# Patient Record
Sex: Female | Born: 1980 | Race: White | Hispanic: No | Marital: Married | Smoking: Never smoker
Health system: Southern US, Community
[De-identification: ages and names within clinical notes are randomized; demographics above are authoritative.]

## PROBLEM LIST (undated history)

## (undated) DIAGNOSIS — F419 Anxiety disorder, unspecified: Secondary | ICD-10-CM

## (undated) DIAGNOSIS — F32A Depression, unspecified: Secondary | ICD-10-CM

## (undated) DIAGNOSIS — B019 Varicella without complication: Secondary | ICD-10-CM

## (undated) DIAGNOSIS — D219 Benign neoplasm of connective and other soft tissue, unspecified: Secondary | ICD-10-CM

## (undated) HISTORY — DX: Anxiety disorder, unspecified: F41.9

## (undated) HISTORY — DX: Benign neoplasm of connective and other soft tissue, unspecified: D21.9

## (undated) HISTORY — PX: BREAST CYST EXCISION: SHX579

## (undated) HISTORY — DX: Varicella without complication: B01.9

## (undated) HISTORY — DX: Depression, unspecified: F32.A

## (undated) HISTORY — PX: TUBAL LIGATION: SHX77

---

## 2001-02-10 ENCOUNTER — Other Ambulatory Visit: Admission: RE | Admit: 2001-02-10 | Discharge: 2001-02-10 | Payer: Self-pay | Admitting: Family Medicine

## 2019-08-30 LAB — RESULTS CONSOLE HPV: CHL HPV: NEGATIVE

## 2019-08-30 LAB — HM PAP SMEAR: HM Pap smear: NORMAL

## 2020-12-10 ENCOUNTER — Ambulatory Visit: Admit: 2020-12-10 | Discharge status: Absent without leave | Payer: Self-pay

## 2021-07-03 ENCOUNTER — Emergency Department (HOSPITAL_BASED_OUTPATIENT_CLINIC_OR_DEPARTMENT_OTHER): Payer: 59

## 2021-07-03 ENCOUNTER — Other Ambulatory Visit: Payer: Self-pay

## 2021-07-03 ENCOUNTER — Other Ambulatory Visit (HOSPITAL_BASED_OUTPATIENT_CLINIC_OR_DEPARTMENT_OTHER): Payer: Self-pay

## 2021-07-03 ENCOUNTER — Encounter (HOSPITAL_BASED_OUTPATIENT_CLINIC_OR_DEPARTMENT_OTHER): Payer: Self-pay

## 2021-07-03 ENCOUNTER — Emergency Department (HOSPITAL_BASED_OUTPATIENT_CLINIC_OR_DEPARTMENT_OTHER)
Admission: EM | Admit: 2021-07-03 | Discharge: 2021-07-03 | Disposition: A | Discharge status: Home / Self Care | Payer: 59 | Attending: Emergency Medicine | Admitting: Emergency Medicine

## 2021-07-03 ENCOUNTER — Emergency Department (HOSPITAL_BASED_OUTPATIENT_CLINIC_OR_DEPARTMENT_OTHER): Payer: 59 | Admitting: Radiology

## 2021-07-03 DIAGNOSIS — W010XXA Fall on same level from slipping, tripping and stumbling without subsequent striking against object, initial encounter: Secondary | ICD-10-CM | POA: Diagnosis not present

## 2021-07-03 DIAGNOSIS — S6992XA Unspecified injury of left wrist, hand and finger(s), initial encounter: Secondary | ICD-10-CM | POA: Diagnosis present

## 2021-07-03 DIAGNOSIS — S52125A Nondisplaced fracture of head of left radius, initial encounter for closed fracture: Secondary | ICD-10-CM | POA: Diagnosis not present

## 2021-07-03 MED ORDER — OXYCODONE-ACETAMINOPHEN 5-325 MG PO TABS
1.0000 | ORAL_TABLET | Freq: Four times a day (QID) | ORAL | 0 refills | Status: DC | PRN
  Filled 2021-07-03: qty 10, 3d supply, fill #0

## 2021-07-03 MED ORDER — OXYCODONE-ACETAMINOPHEN 5-325 MG PO TABS
1.0000 | ORAL_TABLET | Freq: Once | ORAL | Status: AC
  Administered 2021-07-03: 1 via ORAL
  Filled 2021-07-03: qty 1

## 2021-07-03 NOTE — ED Provider Notes (Signed)
[facility] EMERGENCY DEPT Provider Note   CSN: [id number] Arrival date & time: 07/03/21  4818     History  Chief Complaint  Patient presents with   Marletta Lor    Valina Maes is a 41 y.o. female.  Presented to the emergency room after a fall.  States that she tripped over a baby gate and fell from standing landing on her left elbow.  Only pain right now is in her left elbow.  Earlier had some pain in her ankle but this is gone away and she has been able to walk since the accident.  Pain is aching, sore, worse with movement improved with rest, currently moderate.  Denies any medical problems.  No numbness or weakness in her arm or leg.  HPI     Home Medications Prior to Admission medications   Medication Sig Start Date End Date Taking? Authorizing Provider  oxyCODONE-acetaminophen (PERCOCET/ROXICET) 5-325 MG tablet Take 1 tablet by mouth every 6 (six) hours as needed for up to 3 days for severe pain. 07/03/21 07/06/21 Yes Milagros Loll, MD      Allergies    Patient has no known allergies.    Review of Systems   Review of Systems  Musculoskeletal:  Positive for arthralgias.  All other systems reviewed and are negative.  Physical Exam Updated Vital Signs BP (!) 146/86    Pulse 81    Temp 98.6 F (37 C)    Resp 18    Wt 79.4 kg    SpO2 100%  Physical Exam Vitals and nursing note reviewed.  Constitutional:      General: She is not in acute distress.    Appearance: She is well-developed.  HENT:     Head: Normocephalic and atraumatic.  Eyes:     Conjunctiva/sclera: Conjunctivae normal.  Cardiovascular:     Rate and Rhythm: Normal rate and regular rhythm.     Heart sounds: No murmur heard. Pulmonary:     Effort: Pulmonary effort is normal. No respiratory distress.  Musculoskeletal:     Cervical back: Neck supple.     Comments: Left upper extremity: There is swelling, tenderness to the left elbow, no tenderness to palpation throughout remainder of extremity,  radial pulse intact, distal sensation and motor intact Left lower extremity: No tenderness to palpation throughout extremity, no deformity  Skin:    General: Skin is warm and dry.     Capillary Refill: Capillary refill takes less than 2 seconds.  Neurological:     Mental Status: She is alert.  Psychiatric:        Mood and Affect: Mood normal.    ED Results / Procedures / Treatments   Labs (all labs ordered are listed, but only abnormal results are displayed) Labs Reviewed - No data to display  EKG None  Radiology DG Elbow Complete Left  Result Date: 07/03/2021 CLINICAL DATA:  41 year old female status post fall with pain radiating from the elbow. EXAM: LEFT ELBOW - COMPLETE 3+ VIEW COMPARISON:  None. FINDINGS: Positive for evidence of joint effusion on the lateral view. Mildly displaced fracture of the left radial head articular surface. Maintained joint spaces and alignment. Normal underlying bone mineralization. Distal humerus and proximal ulna appear intact. IMPRESSION: Mildly displaced left radial head articular surface fracture with hemarthrosis. Electronically Signed   By: Odessa Fleming M.D.   On: 07/03/2021 09:51   CT Elbow Left Wo Contrast  Result Date: 07/03/2021 CLINICAL DATA:  Fracture, elbow elbow fracture, ortho wants for potential  operative planning EXAM: CT OF THE UPPER LEFT EXTREMITY WITHOUT CONTRAST TECHNIQUE: Multidetector CT imaging of the upper left extremity was performed according to the standard protocol. RADIATION DOSE REDUCTION: This exam was performed according to the departmental dose-optimization program which includes automated exposure control, adjustment of the mA and/or kV according to patient size and/or use of iterative reconstruction technique. COMPARISON:  Same day radiograph FINDINGS: Bones/Joint/Cartilage There is a minimally displaced radial head fracture with 2-3 mm articular surface step-off. Associated small hemarthrosis. There is no proximal ulna  fracture or distal humerus fracture. Ligaments Suboptimally assessed by CT. Muscles and Tendons No acute myotendinous abnormality on CT. Soft tissues No focal fluid collection IMPRESSION: Minimally displaced radial head fracture with 2-3 mm articular surface step-off. Associated small hemarthrosis. Electronically Signed   By: Caprice Renshaw M.D.   On: 07/03/2021 11:05    Procedures Procedures    Medications Ordered in ED Medications  oxyCODONE-acetaminophen (PERCOCET/ROXICET) 5-325 MG per tablet 1 tablet (1 tablet Oral Given 07/03/21 1022)    ED Course/ Medical Decision Making/ A&P Clinical Course as of 07/03/21 1347  Wed Jul 03, 2021  1017 D/w [place] - he wants CT to better characterize fx, regardless of results, needs posterior long arm splint and out pt fu in his clinic [RD]    Clinical Course User Index [RD] Milagros Loll, MD                           Medical Decision Making Amount and/or Complexity of Data Reviewed Radiology: ordered.  Risk Prescription drug management.   41 year old presented to ER with concern for elbow injury after mechanical fall.  On physical exam, only area of tenderness or swelling is left elbow.  Plain films obtained, I independently reviewed and interpreted, concerning for nondisplaced radial head fracture.  I discussed the case with Dr. Dion Saucier with orthopedics.  He requested CT scan in ER to better characterize fracture for follow-up purposes, recommending posterior long-arm splint and follow-up in his clinic.  Patient placed in splint by staff, provided sling.  Recommend NWB for now.  Case also discussed with patient's husband at bedside.  Patient and husband demonstrate understanding of need follow-up in the outpatient setting for further management.    After the discussed management above, the patient was determined to be safe for discharge.  The patient was in agreement with this plan and all questions regarding their care were answered.  ED return  precautions were discussed and the patient will return to the ED with any significant worsening of condition.         Final Clinical Impression(s) / ED Diagnoses Final diagnoses:  Closed nondisplaced fracture of head of left radius, initial encounter    Rx / DC Orders ED Discharge Orders          Ordered    oxyCODONE-acetaminophen (PERCOCET/ROXICET) 5-325 MG tablet  Every 6 hours PRN        07/03/21 1109              Milagros Loll, MD 07/03/21 1347

## 2021-07-03 NOTE — Discharge Instructions (Signed)
Please follow-up with Dr. Dion Saucier.  Call his office to request an appointment.  Until you receive further instructions from the orthopedist, recommend no weightbearing in your left arm.  Use sling as needed.  Keep splint intact.  For pain control, take Tylenol and Motrin as needed.  For breakthrough pain take the prescribed Percocet.  Note this can make you drowsy and should not be taken while driving or operating heavy machinery.

## 2021-07-03 NOTE — ED Triage Notes (Signed)
Pt presents POV for Left elbow pain after a mechanical fall this am. Pt's foot got caught while stepping over the baby gate. Pt denies hitting her head or LOC

## 2021-07-04 ENCOUNTER — Encounter: Payer: Self-pay | Admitting: Physician Assistant

## 2021-07-04 ENCOUNTER — Ambulatory Visit (INDEPENDENT_AMBULATORY_CARE_PROVIDER_SITE_OTHER): Payer: 59 | Admitting: Physician Assistant

## 2021-07-04 DIAGNOSIS — S52122A Displaced fracture of head of left radius, initial encounter for closed fracture: Secondary | ICD-10-CM | POA: Diagnosis not present

## 2021-07-04 NOTE — Progress Notes (Signed)
Office Visit Note   Patient: Stephanie Garcia           Date of Birth: December 25, 1980           MRN: [id number] Visit Date: 07/04/2021              Requested by: Ronal Fear, NP [address] [place],  [place] [address] PCP: Ronal Fear, NP  No chief complaint on file.     HPI: Stephanie Garcia is a pleasant healthy right-hand-dominant 41 year old woman who is 24 hours status post tripping over a baby gate in her home.  She had pain and swelling in her left elbow and was seen and evaluated in the emergency room.  X-rays demonstrated a positive sail sign and a slightly displaced radial head fracture.  CT was also ordered and confirmation.  She was to follow-up with the orthopedic on-call but he is not under her insurance.  She presents today.  She denies any numbing or tingling in her hand.  She denies any previous injuries and in fact says this is her first broken bone.  She is not a smoker she is not a diabetic she is currently taking Motrin and occasionally Percocet for pain  Assessment & Plan: Visit Diagnoses: Left elbow fracture  Plan: Acute radial head fracture of the left elbow.  I did review the CT scan and the x-rays with both Dr. Frazier Butt and the patient.  She will continue to immobilize her arm in a posterior splint.  She is good to use a small squeeze ball to keep some of the swelling out of her fingers and I encouraged her to move her fingers.  She may contact me if she needs a refill of her pain medication.  She will follow-up with Dr. 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   Ortho Exam  Patient is alert, oriented, no adenopathy, well-dressed, normal affect, normal respiratory effort. Examination of her left arm her arm is immobilized in a posterior splint in good condition.  Her compartments are compressible.  She has a strong radial pulse.  She does have some swelling in her  digits but is able to oppose all of her fingers.  Less than 2-second capillary refill no paresthesias no other pain  Imaging: DG Elbow Complete Left  Result Date: 07/03/2021 CLINICAL DATA:  41 year old female status post fall with pain radiating from the elbow. EXAM: LEFT ELBOW - COMPLETE 3+ VIEW COMPARISON:  None. FINDINGS: Positive for evidence of joint effusion on the lateral view. Mildly displaced fracture of the left radial head articular surface. Maintained joint spaces and alignment. Normal underlying bone mineralization. Distal humerus and proximal ulna appear intact. IMPRESSION: Mildly displaced left radial head articular surface fracture with hemarthrosis. Electronically Signed   By: Odessa Fleming M.D.   On: 07/03/2021 09:51   CT Elbow Left Wo Contrast  Result Date: 07/03/2021 CLINICAL DATA:  Fracture, elbow elbow fracture, ortho wants for potential operative planning EXAM: CT OF THE UPPER LEFT EXTREMITY WITHOUT CONTRAST TECHNIQUE: Multidetector CT imaging of the upper left extremity was performed according to the standard protocol. RADIATION DOSE REDUCTION: This exam was performed according to the departmental dose-optimization program which includes automated exposure control, adjustment of the mA and/or kV according to patient size and/or use of iterative reconstruction technique. COMPARISON:  Same day radiograph FINDINGS: Bones/Joint/Cartilage There is a minimally displaced radial head fracture with 2-3  mm articular surface step-off. Associated small hemarthrosis. There is no proximal ulna fracture or distal humerus fracture. Ligaments Suboptimally assessed by CT. Muscles and Tendons No acute myotendinous abnormality on CT. Soft tissues No focal fluid collection IMPRESSION: Minimally displaced radial head fracture with 2-3 mm articular surface step-off. Associated small hemarthrosis. Electronically Signed   By: Caprice Renshaw M.D.   On: 07/03/2021 11:05   No images are attached to the  encounter.  Labs: No results found for: HGBA1C, ESRSEDRATE, CRP, LABURIC, REPTSTATUS, GRAMSTAIN, CULT, LABORGA   No results found for: ALBUMIN, PREALBUMIN, CBC  No results found for: MG No results found for: VD25OH  No results found for: PREALBUMIN No flowsheet data found.   There is no height or weight on file to calculate BMI.  Orders:  No orders of the defined types were placed in this encounter.  No orders of the defined types were placed in this encounter.    Procedures: No procedures performed  Clinical Data: No additional findings.  ROS:  All other systems negative, except as noted in the HPI. Review of Systems  Objective: Vital Signs: There were no vitals taken for this visit.  Specialty Comments:  No specialty comments available.  PMFS History: There are no problems to display for this patient.  No past medical history on file.  No family history on file.  Past Surgical History:  Procedure Laterality Date   BREAST CYST EXCISION     TUBAL LIGATION     Social History   Occupational History   Not on file  Tobacco Use   Smoking status: Never   Smokeless tobacco: Never  Vaping Use   Vaping Use: Never used  Substance and Sexual Activity   Alcohol use: Yes    Comment: social   Drug use: Never   Sexual activity: Never

## 2021-07-05 ENCOUNTER — Other Ambulatory Visit: Payer: Self-pay | Admitting: Surgical

## 2021-07-05 ENCOUNTER — Telehealth: Payer: Self-pay | Admitting: Physician Assistant

## 2021-07-05 MED ORDER — OXYCODONE-ACETAMINOPHEN 5-325 MG PO TABS: 1.0000 | ORAL_TABLET | Freq: Four times a day (QID) | ORAL | 0 refills | Status: AC | PRN

## 2021-07-05 NOTE — Telephone Encounter (Signed)
I just sent in the pain medicine now, refilled oxycodone

## 2021-07-05 NOTE — Telephone Encounter (Signed)
Pt called about update about pain medication before the end of the day. Pt states she need meds for over the weekend. Pt is asking for a call back or see if another dr can sign off on medication. Pt phone number is [phone].

## 2021-07-05 NOTE — Telephone Encounter (Signed)
Could you please advise since Dr. Whitfield/Maryanne are out of office? °

## 2021-07-05 NOTE — Telephone Encounter (Addendum)
Patient called needing Rx refilled Percocet. Patient said she will run out over the weekend. Patient uses the [facility] in [place]   The number to contact patient [phone]

## 2021-07-05 NOTE — Telephone Encounter (Signed)
IC advised.  

## 2021-07-11 ENCOUNTER — Other Ambulatory Visit: Payer: Self-pay

## 2021-07-11 ENCOUNTER — Ambulatory Visit (INDEPENDENT_AMBULATORY_CARE_PROVIDER_SITE_OTHER): Payer: 59 | Admitting: Orthopedic Surgery

## 2021-07-11 DIAGNOSIS — S52122A Displaced fracture of head of left radius, initial encounter for closed fracture: Secondary | ICD-10-CM | POA: Insufficient documentation

## 2021-07-11 NOTE — Progress Notes (Signed)
° °  Office Visit Note   Patient: Stephanie Garcia           Date of Birth: March 03, 1981           MRN: [id number] Visit Date: 07/11/2021              Requested by: Ronal Fear, NP [address] [place],  [place] [address] PCP: Ronal Fear, NP   Assessment & Plan: Visit Diagnoses:  1. Closed displaced fracture of head of left radius, initial encounter     Plan: Reviewed the x-rays and CT scan with the patient and her husband which demonstrate a Mason 2 radial head fracture with a small fragment.  Discussed that the majority of the radial head articular surface is intact.  She has no mechanical block to motion in flexion/extension or pronation/supination.  We will plan on treating this nonoperatively with early ROM.  She can follow up in two weeks.   Follow-Up Instructions: No follow-ups on file.   Orders:  No orders of the defined types were placed in this encounter.  No orders of the defined types were placed in this encounter.     Procedures: No procedures performed   Clinical Data: No additional findings.   Subjective: Chief Complaint  Patient presents with   Left Elbow - Fracture, Follow-up    This is a 41 yo RHD F who presents with a left radial head fracture after tripping over a baby gate at their house just over a week ago.  She has been in a long arm posterior splint.  Her pain has been well controlled in the splint.  She has significant elbow pain w/ attempted ROM out of the splint that is worse w/ flexion/extension.  She denies pain elsewhere in the extremity.  She denies numbness or paresthesias.    Review of Systems   Objective: Vital Signs: There were no vitals taken for this visit.  Physical Exam Constitutional:      Appearance: Normal appearance.  Cardiovascular:     Rate and Rhythm: Normal rate.     Pulses: Normal pulses.  Skin:    General: Skin is warm and dry.     Capillary Refill: Capillary refill takes less than 2 seconds.  Neurological:      Mental Status: She is alert.    Right Elbow Exam   Tenderness  The patient is experiencing tenderness in the radial capitellar joint.   Other  Erythema: absent Sensation: normal Pulse: present  Comments:  Pain w/ active flexion/extension and pronation/supination but without mechanical block to motion.  Full pronation/supination.     Specialty Comments:  No specialty comments available.  Imaging: No results found.   PMFS History: Patient Active Problem List   Diagnosis Date Noted   Left radial head fracture 07/11/2021   No past medical history on file.  No family history on file.  Past Surgical History:  Procedure Laterality Date   BREAST CYST EXCISION     TUBAL LIGATION     Social History   Occupational History   Not on file  Tobacco Use   Smoking status: Never   Smokeless tobacco: Never  Vaping Use   Vaping Use: Never used  Substance and Sexual Activity   Alcohol use: Yes    Comment: social   Drug use: Never   Sexual activity: Never

## 2021-07-25 ENCOUNTER — Ambulatory Visit (INDEPENDENT_AMBULATORY_CARE_PROVIDER_SITE_OTHER): Payer: 59

## 2021-07-25 ENCOUNTER — Ambulatory Visit: Payer: 59 | Admitting: Orthopedic Surgery

## 2021-07-25 ENCOUNTER — Other Ambulatory Visit: Payer: Self-pay

## 2021-07-25 ENCOUNTER — Encounter: Payer: Self-pay | Admitting: Orthopedic Surgery

## 2021-07-25 DIAGNOSIS — S52122A Displaced fracture of head of left radius, initial encounter for closed fracture: Secondary | ICD-10-CM

## 2021-07-25 NOTE — Progress Notes (Signed)
? ?  Office Visit Note ?  ?Patient: Stephanie Garcia           ?Date of Birth: 05-23-1980           ?MRN: [id number] ?Visit Date: 07/25/2021 ?             ?Requested by: Ronal Fear, NP ?[address] [place] ?[place],  [place] [address] ?PCP: Ronal Fear, NP ? ? ?Assessment & Plan: ?Visit Diagnoses:  ?1. Closed displaced fracture of head of left radius, initial encounter   ? ? ?Plan: Patient is doing much better today than at her last visit.  She is using her left arm with minimal discomfort.  She has near complete ROM but lacks approximately 5-10 degrees of terminal extension.  She has full pronation/supination.  She will continue to use her LUE as tolerated.   Discussed that if she has trouble gaining the last few degrees of extension then we can start her in formal therapy.  I can see her back in another month.  ? ?Follow-Up Instructions: No follow-ups on file.  ? ?Orders:  ?Orders Placed This Encounter  ?Procedures  ? XR Elbow Complete Left (3+View)  ? ?No orders of the defined types were placed in this encounter. ? ? ? ? Procedures: ?No procedures performed ? ? ?Clinical Data: ?No additional findings. ? ? ?Subjective: ?Chief Complaint  ?Patient presents with  ? Left Elbow - Follow-up  ? ? ?This is a 41 yo RHD F who presents for follow up of a left radial head fracture after tripping over a baby gate at their house approximately 3 weeks ago.  She has been out of her splint and working on range of motion for the last two weeks.  She is using her LUE with her usual daily activities.  She has regained almost all of her ROM.  She has some mild radial sided "tightness" with certain activities.  ? ? ?Review of Systems ? ? ?Objective: ?Vital Signs: There were no vitals taken for this visit. ? ?Physical Exam ? ?Left Elbow Exam  ? ?Tenderness  ?Left elbow tenderness location: Mild tenderness at radial head.  ? ?Other  ?Erythema: absent ?Sensation: normal ?Pulse: present ? ?Comments:  Near full flexion.  Lacks approximately 10 degrees  of extension.  Full pronation and supination.  ? ? ? ? ?Specialty Comments:  ?No specialty comments available. ? ?Imaging: ?No results found. ? ? ?PMFS History: ?Patient Active Problem List  ? Diagnosis Date Noted  ? Left radial head fracture 07/11/2021  ? ?History reviewed. No pertinent past medical history.  ?History reviewed. No pertinent family history.  ?Past Surgical History:  ?Procedure Laterality Date  ? BREAST CYST EXCISION    ? TUBAL LIGATION    ? ?Social History  ? ?Occupational History  ? Not on file  ?Tobacco Use  ? Smoking status: Never  ? Smokeless tobacco: Never  ?Vaping Use  ? Vaping Use: Never used  ?Substance and Sexual Activity  ? Alcohol use: Yes  ?  Comment: social  ? Drug use: Never  ? Sexual activity: Never  ? ? ? ? ? ? ?

## 2021-08-22 ENCOUNTER — Encounter: Payer: Self-pay | Admitting: Orthopedic Surgery

## 2021-08-22 ENCOUNTER — Ambulatory Visit: Payer: 59 | Admitting: Orthopedic Surgery

## 2021-08-22 DIAGNOSIS — S52122A Displaced fracture of head of left radius, initial encounter for closed fracture: Secondary | ICD-10-CM

## 2021-08-22 NOTE — Progress Notes (Signed)
? ?  Office Visit Note ?  ?Patient: Stephanie Garcia           ?Date of Birth: 09-22-1980           ?MRN: [id number] ?Visit Date: 08/22/2021 ?             ?Requested by: Philmore Pali, NP ?[address] ?[place],  [place] [address] ?PCP: Philmore Pali, NP ? ? ?Assessment & Plan: ?Visit Diagnoses:  ?1. Closed displaced fracture of head of left radius, initial encounter   ? ? ?Plan: Patient is 7 weeks s/p above fracture treated conservatively with early ROM.  She is doing very well.  She has full and painless elbow ROM.  She is overall very pleased with her result.  She can follow up with me as needed.  ? ?Follow-Up Instructions: No follow-ups on file.  ? ?Orders:  ?No orders of the defined types were placed in this encounter. ? ?No orders of the defined types were placed in this encounter. ? ? ? ? Procedures: ?No procedures performed ? ? ?Clinical Data: ?No additional findings. ? ? ?Subjective: ?Chief Complaint  ?Patient presents with  ? Left Elbow - Follow-up  ? ? ?This is a 41 yo RHD F who presents for follow up of a left radial head fracture after tripping over a baby gate at their house approximately 7 weeks ago.  We have treated the fracture conservatively with early ROM.  She has no pain today w/ full ROM.  She is able to do all of her daily tasks including carrying 50 lb chicken feed sacks without difficulty.  ? ? ?Review of Systems ? ? ?Objective: ?Vital Signs: There were no vitals taken for this visit. ? ?Physical Exam ? ?Left Elbow Exam  ? ?Tenderness  ?The patient is experiencing no tenderness.  ? ?Range of Motion  ?The patient has normal left elbow ROM. ? ?Other  ?Erythema: absent ?Sensation: normal ?Pulse: present ? ? ? ? ?Specialty Comments:  ?No specialty comments available. ? ?Imaging: ?No results found. ? ? ?PMFS History: ?Patient Active Problem List  ? Diagnosis Date Noted  ? Left radial head fracture 07/11/2021  ? ?History reviewed. No pertinent past medical history.  ?History reviewed. No pertinent family  history.  ?Past Surgical History:  ?Procedure Laterality Date  ? BREAST CYST EXCISION    ? TUBAL LIGATION    ? ?Social History  ? ?Occupational History  ? Not on file  ?Tobacco Use  ? Smoking status: Never  ? Smokeless tobacco: Never  ?Vaping Use  ? Vaping Use: Never used  ?Substance and Sexual Activity  ? Alcohol use: Yes  ?  Comment: social  ? Drug use: Never  ? Sexual activity: Never  ? ? ? ? ? ? ?

## 2023-04-29 IMAGING — CT CT ELBOW*L* W/O CM
4 series · 10 of 46 positions shown, 15 images · non-contrast
Comparison: Same day radiograph

CLINICAL DATA: Fracture, elbow elbow fracture, ortho wants for
potential operative planning

EXAM:
CT OF THE UPPER LEFT EXTREMITY WITHOUT CONTRAST
TECHNIQUE: Multidetector CT imaging of the upper left extremity was performed
according to the standard protocol.
RADIATION DOSE REDUCTION: This exam was performed according to the
departmental dose-optimization program which includes automated
exposure control, adjustment of the mA and/or kV according to
patient size and/or use of iterative reconstruction technique.

[Series 2: thin bone · axial · 0.49mm/px · z∈[+88,+111]mm · 2 of 381 slices shown]
[im 31/381  bone]
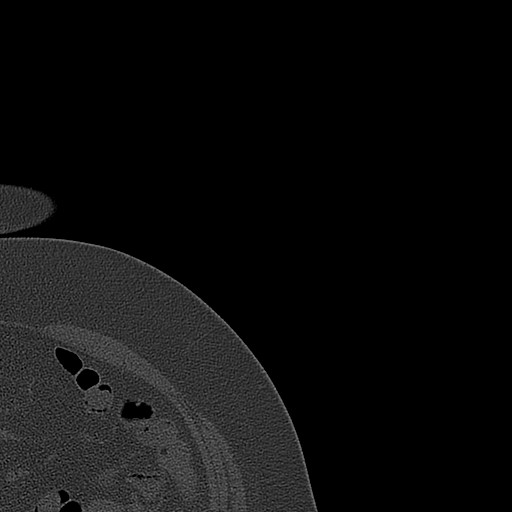
[im 77/381  bone]
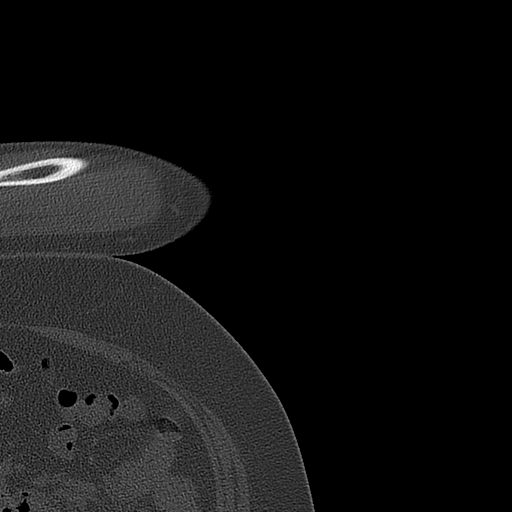

[Series 5: axial soft · axial · 0.49mm/px · z∈[+135,+234]mm · 4 of 90 slices shown, 9 images]
[im 18/90  soft-tissue]
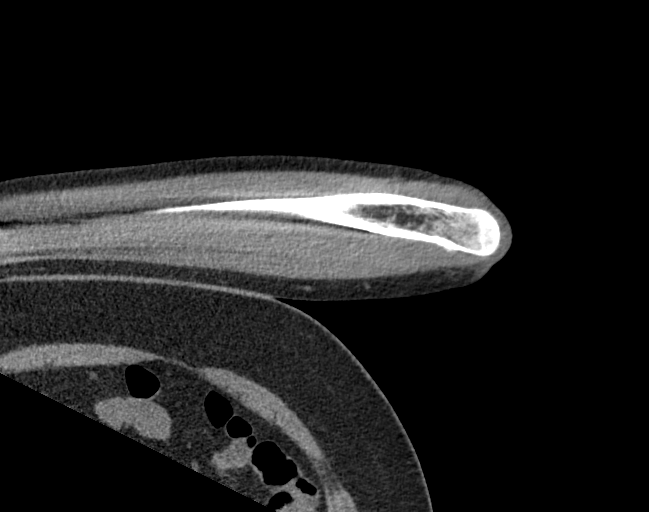
[im 18/90  lung]
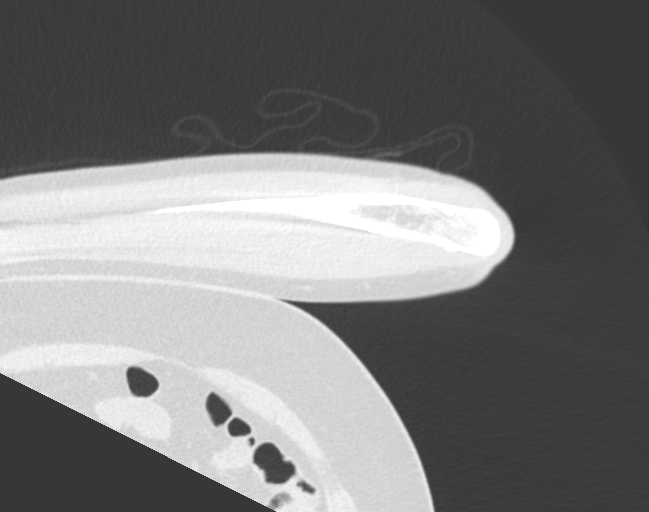
[im 18/90  bone]
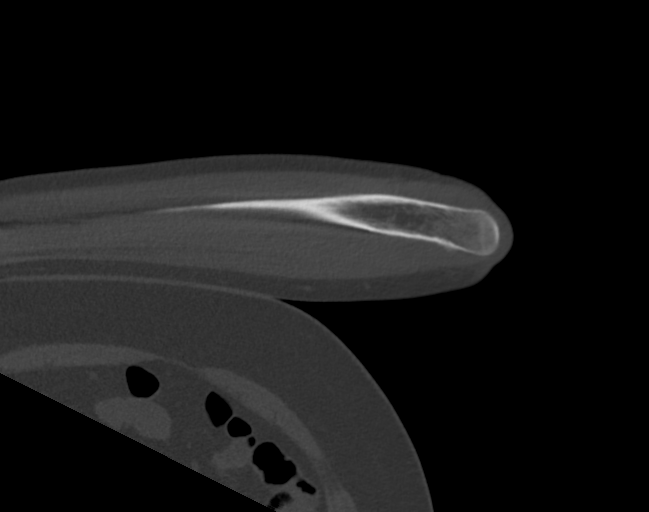
[im 36/90  soft-tissue]
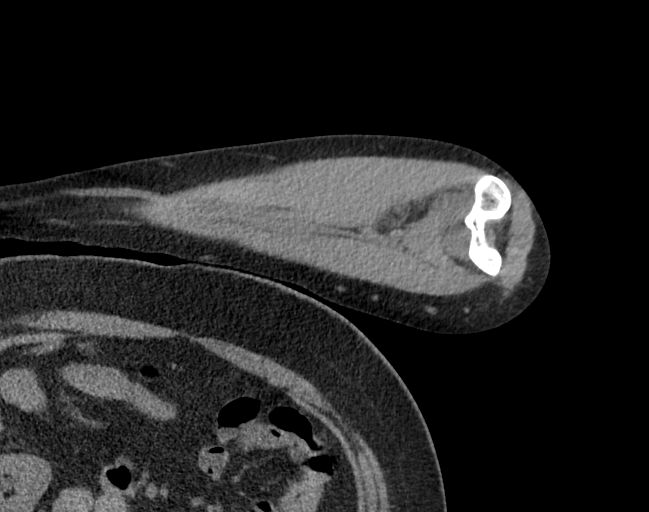
[im 36/90  lung]
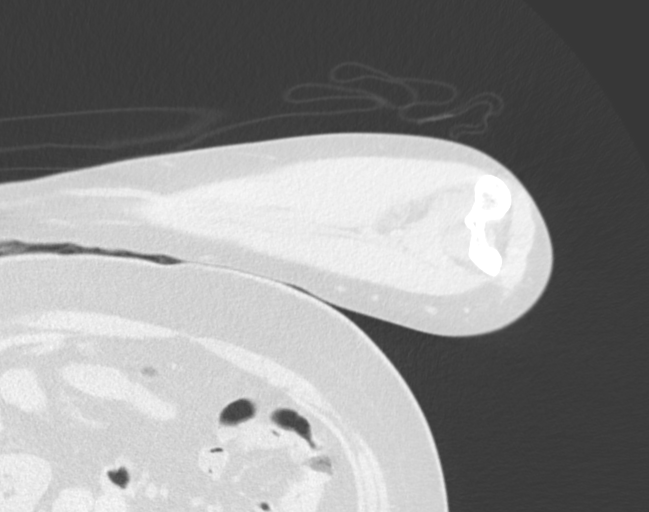
[im 54/90  soft-tissue]
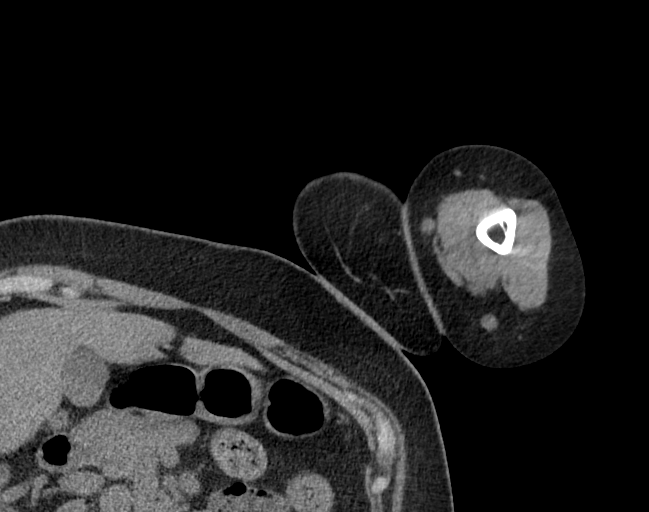
[im 54/90  lung]
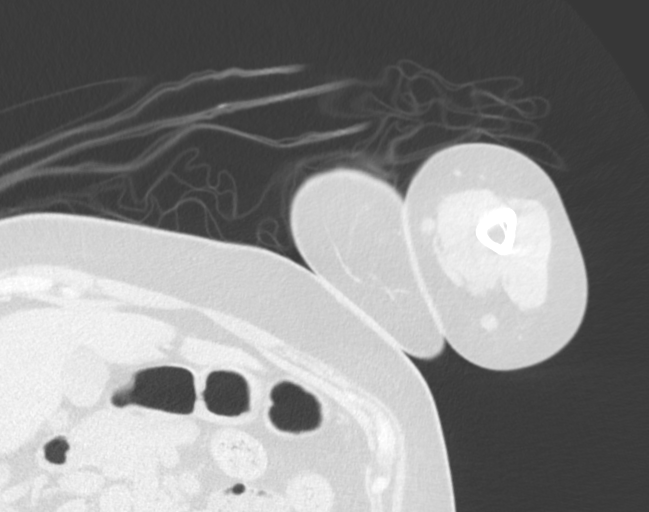
[im 72/90  soft-tissue]
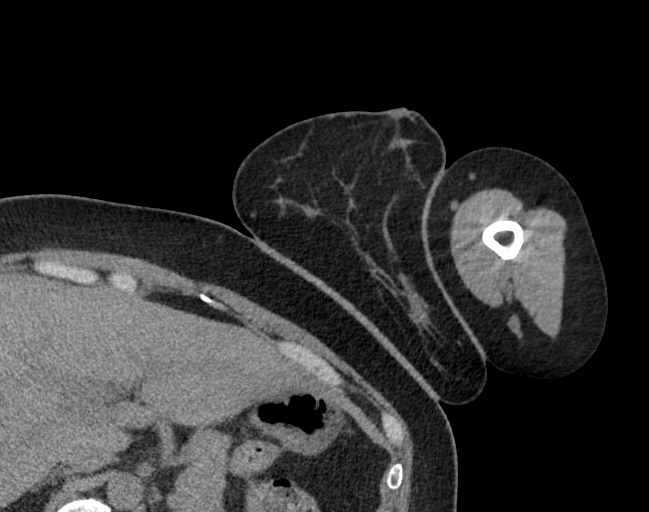
[im 72/90  lung]
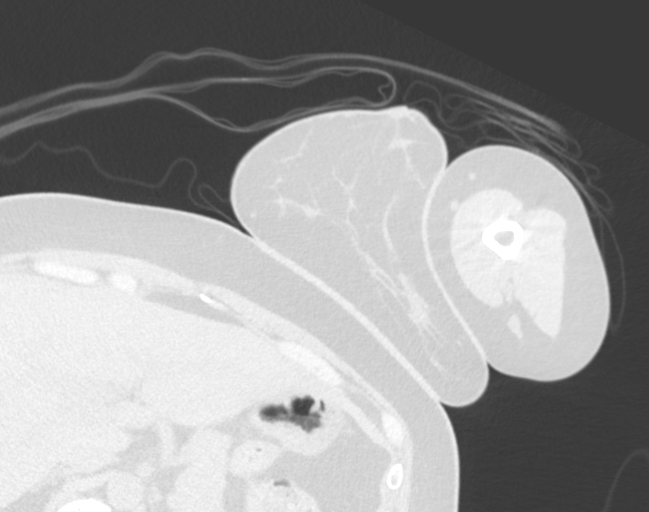

[Series 7: coronal soft · sagittal · 0.35mm/px · 1 of 157 slices shown]
[im 64/157  soft-tissue]
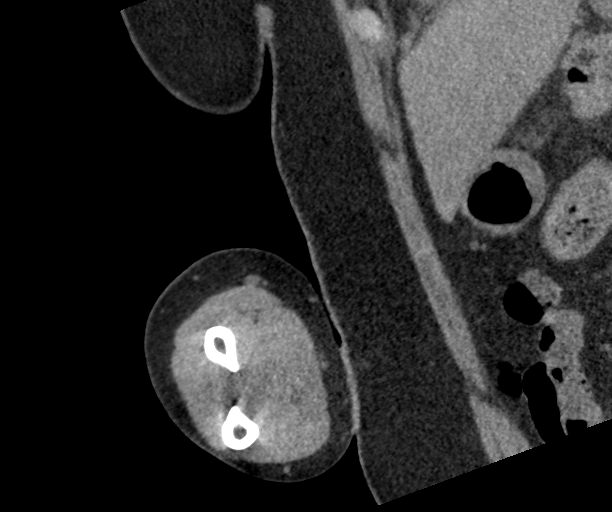

[Series 9: sagittal soft · coronal · 0.35mm/px · 3 of 56 slices shown]
[im 19/56  soft-tissue]
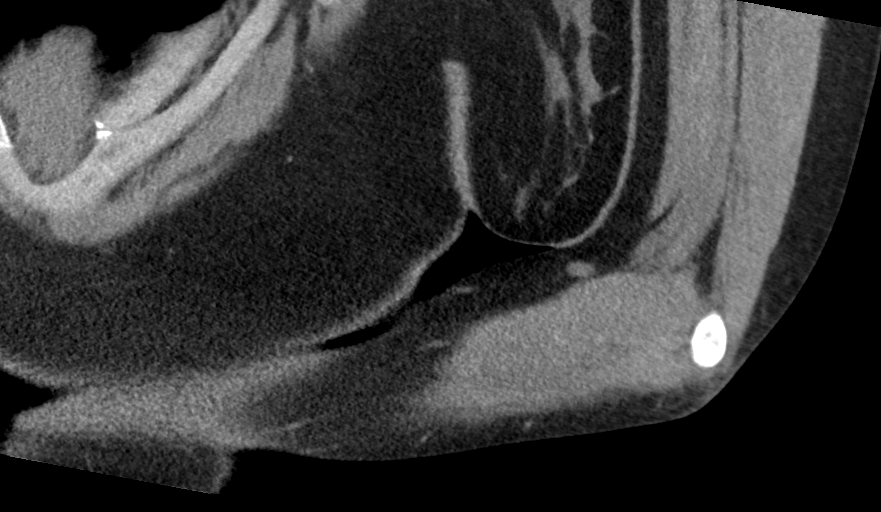
[im 25/56  soft-tissue]
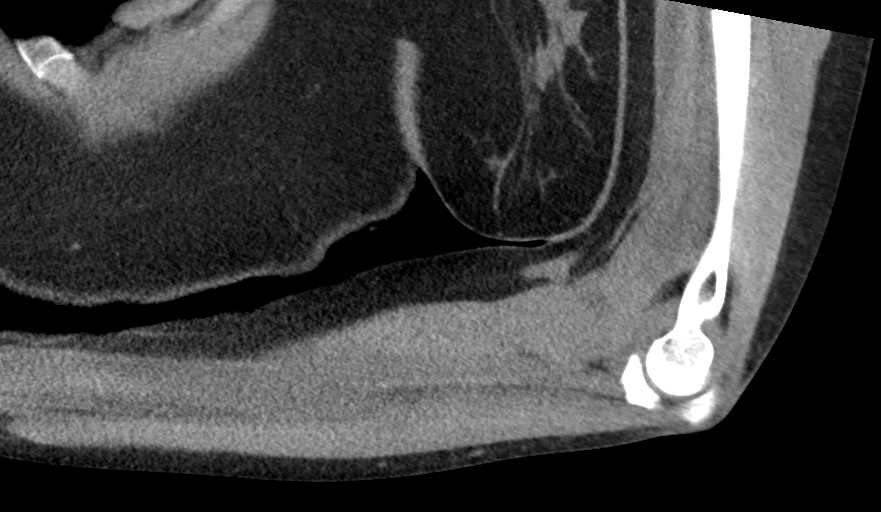
[im 31/56  soft-tissue]
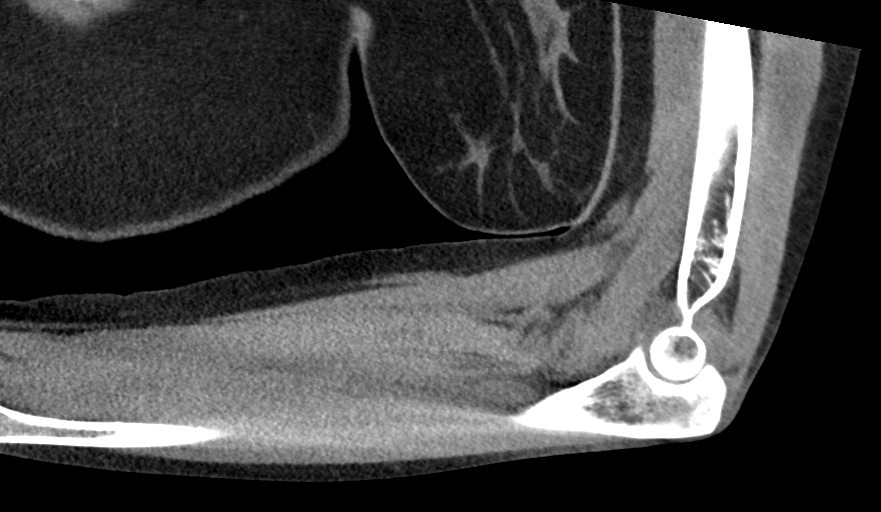

[10 of 46 positions shown; findings below may reference images not displayed]

FINDINGS: Bones/Joint/Cartilage

There is a minimally displaced radial head fracture with 2-3 mm
articular surface step-off. Associated small hemarthrosis. There is
no proximal ulna fracture or distal humerus fracture.

Ligaments

Suboptimally assessed by CT.

Muscles and Tendons

No acute myotendinous abnormality on CT.

Soft tissues

No focal fluid collection
IMPRESSION: Minimally displaced radial head fracture with 2-3 mm articular
surface step-off. Associated small hemarthrosis.

## 2023-04-29 IMAGING — DX DG ELBOW COMPLETE 3+V*L*
4 series · 4 of 4 positions shown · non-contrast
Comparison: None.

CLINICAL DATA: 40-year-old female status post fall with pain
radiating from the elbow.

EXAM:
LEFT ELBOW - COMPLETE 3+ VIEW

[elbow ap]
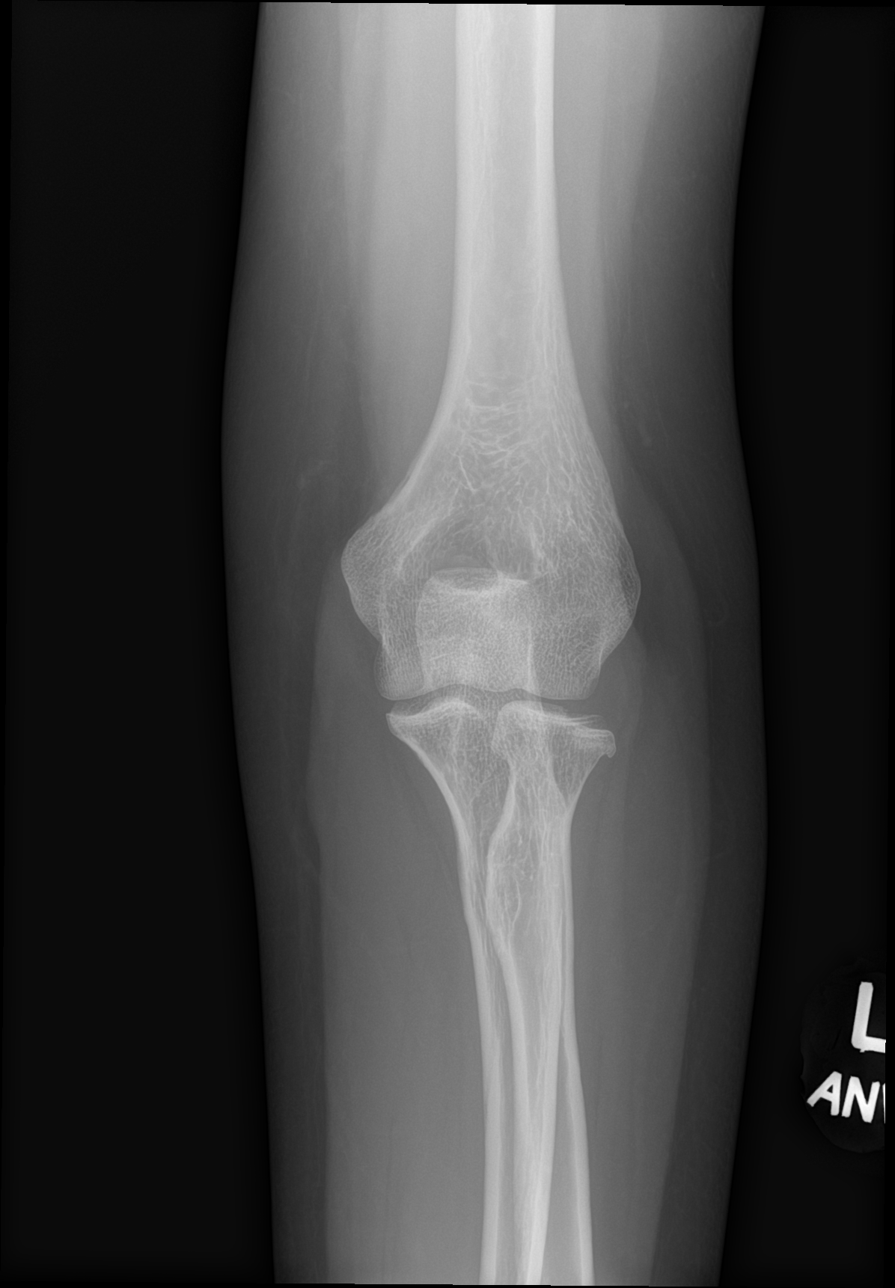

[elbow obl (1 of 2)]
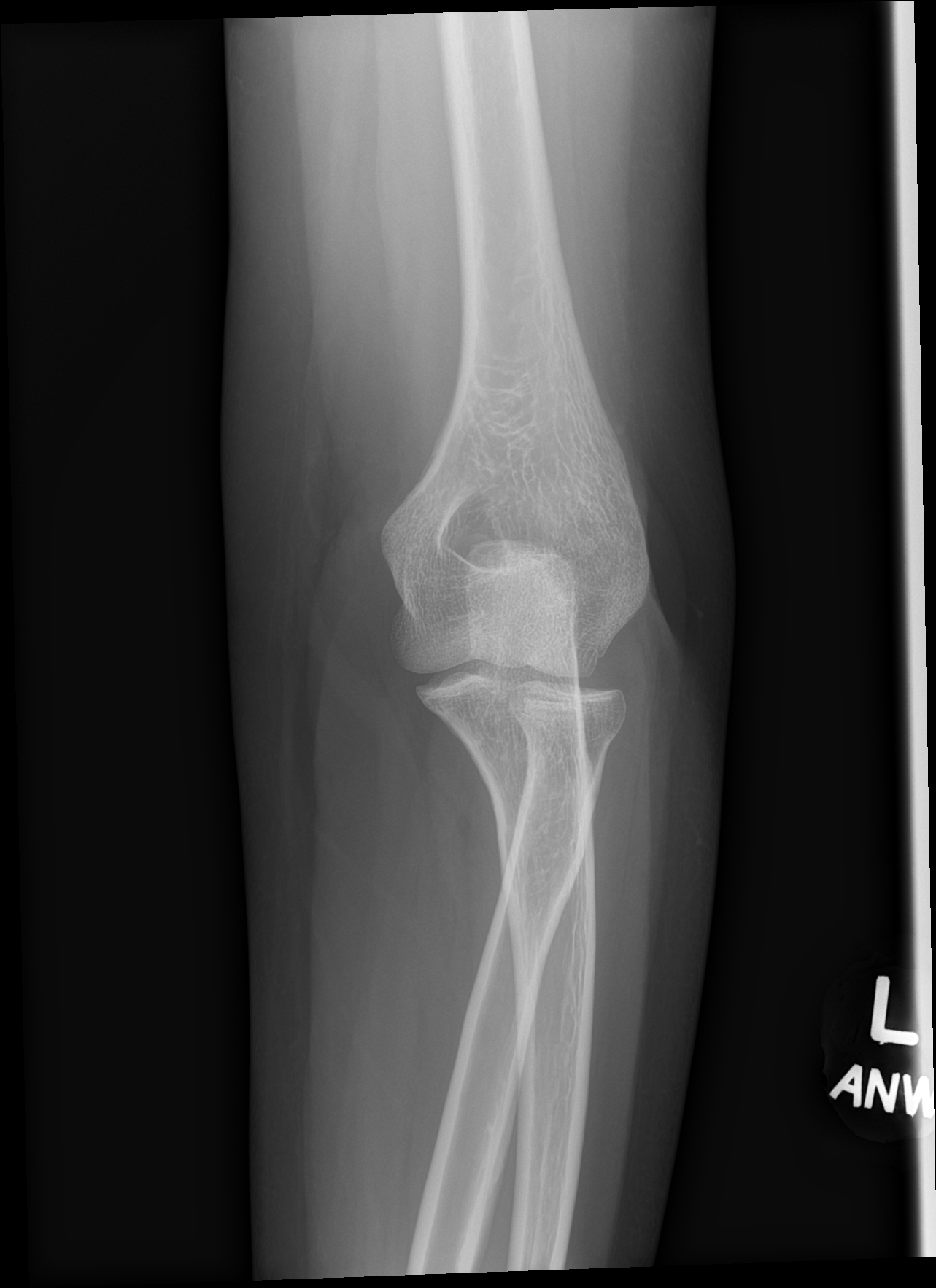

[elbow obl (2 of 2)]
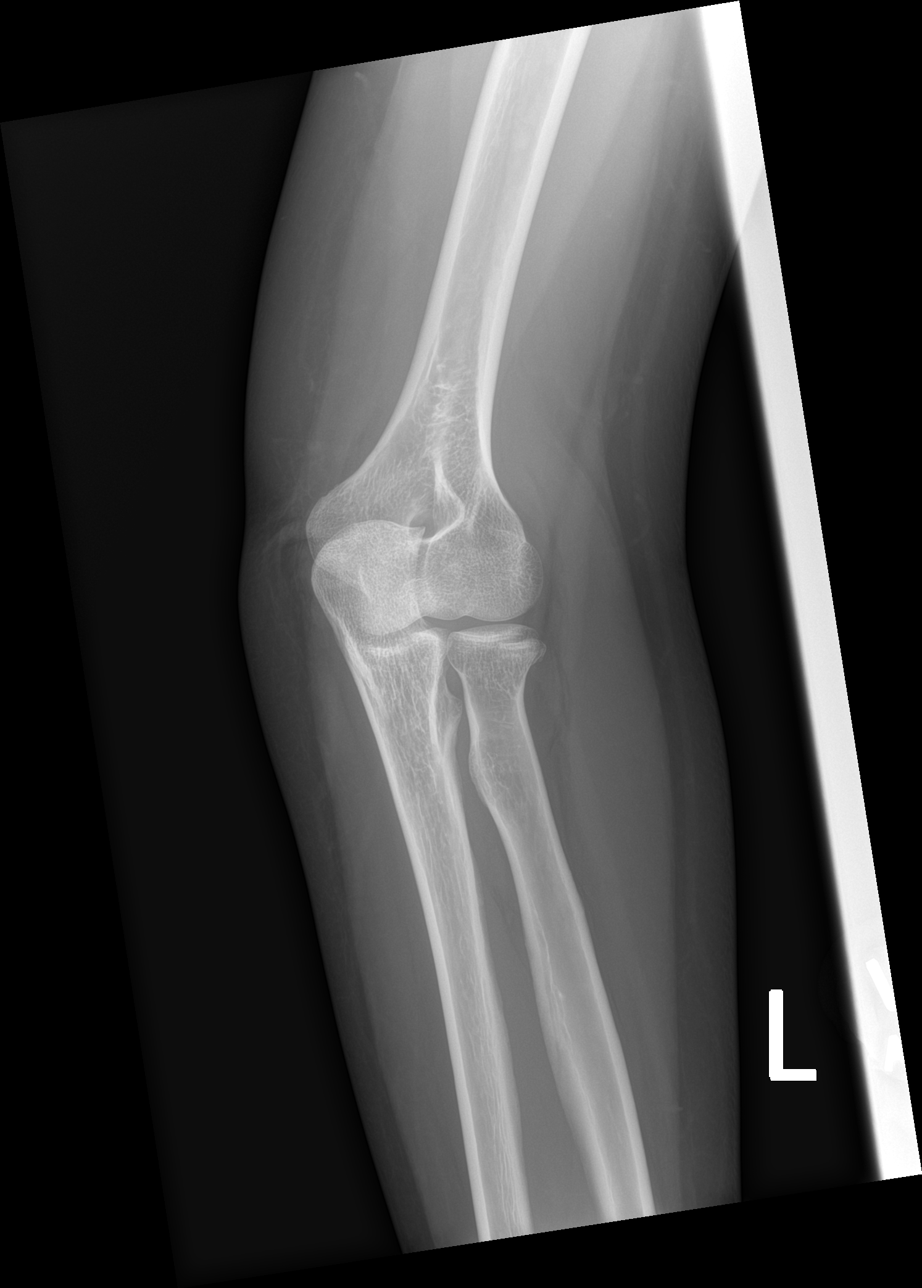

[elbow lat]
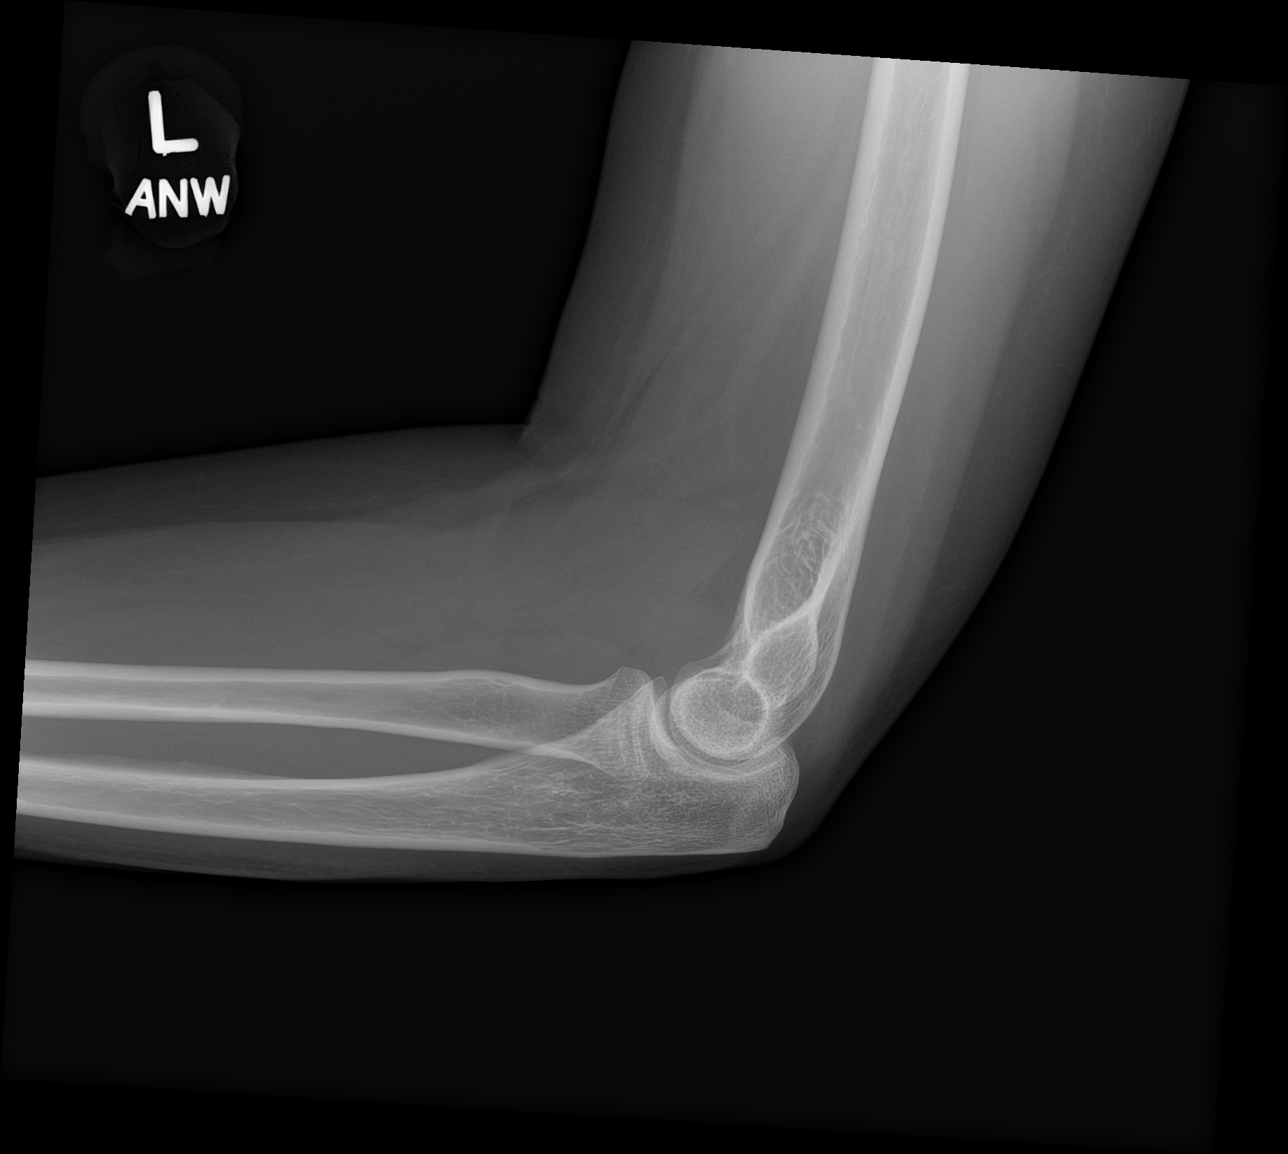

[4 of 4 positions shown; findings below may reference images not displayed]

FINDINGS: Positive for evidence of joint effusion on the lateral view. Mildly
displaced fracture of the left radial head articular surface.
Maintained joint spaces and alignment. Normal underlying bone
mineralization. Distal humerus and proximal ulna appear intact.
IMPRESSION: Mildly displaced left radial head articular surface fracture with
hemarthrosis.

## 2023-06-18 ENCOUNTER — Ambulatory Visit: Payer: 59 | Admitting: Urgent Care

## 2023-06-18 ENCOUNTER — Encounter: Payer: Self-pay | Admitting: Urgent Care

## 2023-06-18 ENCOUNTER — Other Ambulatory Visit: Payer: Self-pay

## 2023-06-18 VITALS — BP 124/84 | HR 97 | Ht 68.0 in | Wt 192.4 lb

## 2023-06-18 DIAGNOSIS — R002 Palpitations: Secondary | ICD-10-CM | POA: Diagnosis not present

## 2023-06-18 DIAGNOSIS — N888 Other specified noninflammatory disorders of cervix uteri: Secondary | ICD-10-CM | POA: Insufficient documentation

## 2023-06-18 DIAGNOSIS — R5383 Other fatigue: Secondary | ICD-10-CM | POA: Insufficient documentation

## 2023-06-18 DIAGNOSIS — G4700 Insomnia, unspecified: Secondary | ICD-10-CM

## 2023-06-18 DIAGNOSIS — F341 Dysthymic disorder: Secondary | ICD-10-CM | POA: Insufficient documentation

## 2023-06-18 DIAGNOSIS — F411 Generalized anxiety disorder: Secondary | ICD-10-CM

## 2023-06-18 LAB — CBC WITH DIFFERENTIAL/PLATELET
Basophils Absolute: 0.1 10*3/uL (ref 0.0–0.1)
Basophils Relative: 1.1 % (ref 0.0–3.0)
Eosinophils Absolute: 0.1 10*3/uL (ref 0.0–0.7)
Eosinophils Relative: 0.8 % (ref 0.0–5.0)
HCT: 41.5 % (ref 36.0–46.0)
Hemoglobin: 13.7 g/dL (ref 12.0–15.0)
Lymphocytes Relative: 9.7 % — ABNORMAL LOW (ref 12.0–46.0)
Lymphs Abs: 0.8 10*3/uL (ref 0.7–4.0)
MCHC: 33 g/dL (ref 30.0–36.0)
MCV: 87.9 fL (ref 78.0–100.0)
Monocytes Absolute: 0.8 10*3/uL (ref 0.1–1.0)
Monocytes Relative: 9.2 % (ref 3.0–12.0)
Neutro Abs: 6.9 10*3/uL (ref 1.4–7.7)
Neutrophils Relative %: 79.2 % — ABNORMAL HIGH (ref 43.0–77.0)
Platelets: 384 10*3/uL (ref 150.0–400.0)
RBC: 4.71 Mil/uL (ref 3.87–5.11)
RDW: 14.1 % (ref 11.5–15.5)
WBC: 8.7 10*3/uL (ref 4.0–10.5)

## 2023-06-18 LAB — COMPREHENSIVE METABOLIC PANEL
ALT: 18 U/L (ref 0–35)
AST: 15 U/L (ref 0–37)
Albumin: 4.8 g/dL (ref 3.5–5.2)
Alkaline Phosphatase: 68 U/L (ref 39–117)
BUN: 10 mg/dL (ref 6–23)
CO2: 26 meq/L (ref 19–32)
Calcium: 10 mg/dL (ref 8.4–10.5)
Chloride: 104 meq/L (ref 96–112)
Creatinine, Ser: 0.76 mg/dL (ref 0.40–1.20)
GFR: 95.73 mL/min (ref >60.00)
Glucose, Bld: 87 mg/dL (ref 70–99)
Potassium: 4.6 meq/L (ref 3.5–5.1)
Sodium: 140 meq/L (ref 135–145)
Total Bilirubin: 0.3 mg/dL (ref 0.2–1.2)
Total Protein: 7.4 g/dL (ref 6.0–8.3)

## 2023-06-18 LAB — TSH: TSH: 0.91 u[IU]/mL (ref 0.35–5.50)

## 2023-06-18 LAB — B12 AND FOLATE PANEL
Folate: >25.2 ng/mL (ref >5.9)
Vitamin B-12: 171 pg/mL — ABNORMAL LOW (ref 211–911)

## 2023-06-18 LAB — VITAMIN D 25 HYDROXY (VIT D DEFICIENCY, FRACTURES): VITD: 30.73 ng/mL (ref 30.00–100.00)

## 2023-06-18 LAB — HEMOGLOBIN A1C: Hgb A1c MFr Bld: 5.4 % (ref 4.6–6.5)

## 2023-06-18 MED ORDER — BUSPIRONE HCL 10 MG PO TABS: ORAL_TABLET | ORAL | 2 refills | Status: DC

## 2023-06-18 NOTE — Patient Instructions (Addendum)
Please start taking buspirone per taper pack directions.  Melatonin 10mg  thirty minutes before getting into bed Magnesium Glycinate   I would strongly encourage you to perform several of the following: - look into purchasing a SAD light (also known as happy light, or light box therapy). It in essence is a light that mimics the effects of sunshine, thus increasing serotonin levels. You can purchase these online, at [facility] or [employer] for around $40.  - over the counter L-tryptophan. Taking 500mg  three times daily, or 1000mg  prior to bed. This medication can make you feel sleepy  - additional over the counter stress relief medications --> "Boiron - Stress calm" are natural herbal tablets  - essential oils, in particular lavender, can be helpful  - practicing deep breathing. This helps more with stress and anxiety  - caffeine and sugar are linked to depression. Drinking more water, increasing physical activity and eating a more "anti-inflammatory" diet (such as natural fruits and veggies) can help.   4-6 week follow up with pap smear, possibly fasting lipid panel

## 2023-06-18 NOTE — Progress Notes (Signed)
New Patient Office Visit  Subjective:  Patient ID: Stephanie Garcia, female    DOB: 07-Aug-1980  Age: 43 y.o. MRN: [id number]  CC:  Chief Complaint  Patient presents with   Establish Care    New pt est care. She wants to discuss depression and anxiety.    HPI Stephanie Garcia presents to establish care  Discussed the use of AI scribe software for clinical note transcription with the patient, who gave verbal consent to proceed.  History of Present Illness   The patient presents with anxiety and depression.   She has experienced worsening anxiety and depression over the past year, characterized by continuous worry about both minor and major issues, irritability, and emotional sensitivity, such as crying easily. Her husband has observed her overreacting to situations, particularly with her children. She describes racing thoughts at night, impacting her ability to relax and sleep. She has a history of miscarriages, which she believes may have contributed to her anxiety. Although her anxiety symptoms have been present for a long time, they have become more difficult to manage in the past year. She has not previously sought treatment for these symptoms.  She experiences physical symptoms of anxiety, such as sweating, tachycardia, and gastrointestinal issues like nausea and diarrhea, particularly when very anxious. No chronic stomach issues are reported, and she eats adequately, though not as healthily as she would like.  She reports trouble falling asleep due to racing thoughts, though she generally sleeps through the night once asleep. She sometimes wakes up in the middle of the night and has difficulty falling back asleep. She has tried to avoid sugar and caffeine before bed to improve her sleep.  Her menstrual cycles have been regular, though she has had a few instances of early periods in the past year. She experiences heavier bleeding at the start of her periods, which then lingers. She reports  occasional hot flashes a few days before her period but no other menopausal symptoms.  She engages in physical activity by walking her dogs twice a day, though she describes it as a leisurely walk rather than brisk exercise. She enjoys reading and watching TV with her husband as personal time. She has a supportive family environment, with her husband working from home part-time and her children being in middle school, high school, and college.       Outpatient Encounter Medications as of 06/18/2023  Medication Sig   ascorbic acid (VITAMIN C) 500 MG tablet Take 500 mg by mouth daily.   busPIRone (BUSPAR) 10 MG tablet Take 1/2 tab once daily x 4 days, then 1/2 tab twice daily x 4 days, then 1 tab in AM, 1/2 tab in PM x 4 days, then 1 tab BID   cyclobenzaprine (FLEXERIL) 10 MG tablet Take 10 mg by mouth 3 (three) times daily as needed. (Patient not taking: Reported on 06/18/2023)   ibuprofen (ADVIL) 800 MG tablet Take 800 mg by mouth 3 (three) times daily. (Patient not taking: Reported on 06/18/2023)   No facility-administered encounter medications on file as of 06/18/2023.    Past Medical History:  Diagnosis Date   Anxiety    want to talk about   Depression    want to talk about    Past Surgical History:  Procedure Laterality Date   BREAST CYST EXCISION     TUBAL LIGATION      Family History  Problem Relation Age of Onset   Diabetes Father    Alcohol abuse Paternal Grandfather  Asthma Paternal Grandmother    Diabetes Paternal Grandmother     Social History   Socioeconomic History   Marital status: Married    Spouse name: Not on file   Number of children: Not on file   Years of education: Not on file   Highest education level: Some college, no degree  Occupational History   Not on file  Tobacco Use   Smoking status: Never   Smokeless tobacco: Never  Vaping Use   Vaping status: Never Used  Substance and Sexual Activity   Alcohol use: Yes    Alcohol/week: 1.0 standard  drink of alcohol    Types: 1 Standard drinks or equivalent per week    Comment: social   Drug use: Never   Sexual activity: Yes    Birth control/protection: Surgical    Comment: Tubal Ligation  Other Topics Concern   Not on file  Social History Narrative   Not on file   Social Drivers of Health   Financial Resource Strain: Low Risk  (06/12/2023)   Overall Financial Resource Strain (CARDIA)    Difficulty of Paying Living Expenses: Not hard at all  Food Insecurity: No Food Insecurity (06/12/2023)   Hunger Vital Sign    Worried About Running Out of Food in the Last Year: Never true    Ran Out of Food in the Last Year: Never true  Transportation Needs: No Transportation Needs (06/12/2023)   PRAPARE - Administrator, Civil Service (Medical): No    Lack of Transportation (Non-Medical): No  Physical Activity: Unknown (06/12/2023)   Exercise Vital Sign    Days of Exercise per Week: 0 days    Minutes of Exercise per Session: Not on file  Stress: Stress Concern Present (06/12/2023)   [employer] of Occupational Health - Occupational Stress Questionnaire    Feeling of Stress : Rather much  Social Connections: Unknown (06/12/2023)   Social Connection and Isolation Panel [NHANES]    Frequency of Communication with Friends and Family: Three times a week    Frequency of Social Gatherings with Friends and Family: Once a week    Attends Religious Services: Patient declined    Database administrator or Organizations: Patient declined    Attends Engineer, structural: Not on file    Marital Status: Married  Catering manager Violence: Not on file    ROS: as noted in HPI  Objective:  BP 124/84   Pulse 97   Ht 5\' 8"  (1.727 m)   Wt 192 lb 6.4 oz (87.3 kg)   SpO2 99%   BMI 29.25 kg/m   Physical Exam Vitals and nursing note reviewed.  Constitutional:      General: She is not in acute distress.    Appearance: Normal appearance. She is not ill-appearing,  toxic-appearing or diaphoretic.  HENT:     Head: Normocephalic and atraumatic.     Nose: Nose normal.     Mouth/Throat:     Mouth: Mucous membranes are moist.  Eyes:     General: No scleral icterus.       Right eye: No discharge.        Left eye: No discharge.     Extraocular Movements: Extraocular movements intact.     Pupils: Pupils are equal, round, and reactive to light.  Neck:     Thyroid: No thyroid mass, thyromegaly or thyroid tenderness.  Cardiovascular:     Rate and Rhythm: Normal rate and regular rhythm.  Pulses: Normal pulses.     Heart sounds: No murmur heard. Pulmonary:     Effort: Pulmonary effort is normal. No respiratory distress.     Breath sounds: Normal breath sounds. No stridor. No wheezing or rhonchi.  Musculoskeletal:     Cervical back: Normal range of motion and neck supple.  Lymphadenopathy:     Cervical: No cervical adenopathy.  Skin:    General: Skin is warm and dry.     Coloration: Skin is not jaundiced.     Findings: No bruising, erythema or rash.  Neurological:     General: No focal deficit present.     Mental Status: She is alert and oriented to person, place, and time.     Sensory: No sensory deficit.     Motor: No weakness.  Psychiatric:        Mood and Affect: Mood normal.        Behavior: Behavior normal.        06/18/2023   10:10 AM  Depression screen PHQ 2/9  Decreased Interest 1  Down, Depressed, Hopeless 1  PHQ - 2 Score 2  Altered sleeping 3  Tired, decreased energy 2  Change in appetite 0  Feeling bad or failure about yourself  2  Trouble concentrating 1  Moving slowly or fidgety/restless 1  Suicidal thoughts 0  PHQ-9 Score 11  Difficult doing work/chores Very difficult       06/18/2023   10:10 AM  GAD 7 : Generalized Anxiety Score  Nervous, Anxious, on Edge 3  Control/stop worrying 3  Worry too much - different things 3  Trouble relaxing 3  Restless 1  Easily annoyed or irritable 3  Afraid - awful might  happen 2  Total GAD 7 Score 18  Anxiety Difficulty Very difficult        Assessment & Plan:  Persistent depressive disorder Assessment & Plan: -Continue regular physical activity, including walking dogs twice a day. Consider increasing intensity of exercise to help manage anxiety. -Consider use of a Seasonal Affective Disorder (SAD) light for 10-15 minutes daily to help regulate mood. -Schedule follow-up appointment in 4-6 weeks   Current PHQ-9 score: 11 Current GAD-7 score: 18   Generalized anxiety disorder Assessment & Plan: Anxiety and Depression Increased anxiety and depressive symptoms over the past year, with difficulty relaxing and continuous worry. 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 Physical symptoms of anxiety include sweating, fast heart rate, and gastrointestinal issues during periods of high anxiety. -Start Buspirone, beginning with a half tablet for three to four days, then increasing to a half tablet twice a day, with the goal of reaching a maintenance dose of 10mg  twice a day. -Consider cognitive behavioral therapy for mind training and retraining the brain to manage anxiety and depressive thoughts.   Insomnia, unspecified type Assessment & Plan: Insomnia Difficulty falling asleep due to racing thoughts, with occasional waking during the night. -Implement sleep hygiene practices, including turning off electronics 30 minutes before bed and engaging in relaxing activities. -Consider over-the-counter sleep aids such as Melatonin (10mg ) and Magnesium Glycinate.   Palpitations -     TSH -     VITAMIN D 25 Hydroxy (Vit-D Deficiency, Fractures) -     Comprehensive metabolic panel -     B12 and Folate Panel -     CBC with Differential/Platelet -     Hemoglobin A1c  Other fatigue -     TSH -     VITAMIN D 25 Hydroxy (  Vit-D Deficiency, Fractures) -     Comprehensive metabolic panel -     B12 and Folate Panel -     CBC with  Differential/Platelet -     Hemoglobin A1c  Other orders -     busPIRone HCl; Take 1/2 tab once daily x 4 days, then 1/2 tab twice daily x 4 days, then 1 tab in AM, 1/2 tab in PM x 4 days, then 1 tab BID  Dispense: 60 tablet; Refill: 2         Return in about 4 weeks (around 07/16/2023).   Maretta Bees, PA

## 2023-06-18 NOTE — Assessment & Plan Note (Signed)
Insomnia Difficulty falling asleep due to racing thoughts, with occasional waking during the night. -Implement sleep hygiene practices, including turning off electronics 30 minutes before bed and engaging in relaxing activities. -Consider over-the-counter sleep aids such as Melatonin (10mg ) and Magnesium Glycinate.

## 2023-06-18 NOTE — Assessment & Plan Note (Signed)
-  Continue regular physical activity, including walking dogs twice a day. Consider increasing intensity of exercise to help manage anxiety. -Consider use of a Seasonal Affective Disorder (SAD) light for 10-15 minutes daily to help regulate mood. -Schedule follow-up appointment in 4-6 weeks   Current PHQ-9 score: 11 Current GAD-7 score: 18

## 2023-06-18 NOTE — Assessment & Plan Note (Signed)
Anxiety and Depression Increased anxiety and depressive symptoms over the past year, with difficulty relaxing and continuous worry. 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 Physical symptoms of anxiety include sweating, fast heart rate, and gastrointestinal issues during periods of high anxiety. -Start Buspirone, beginning with a half tablet for three to four days, then increasing to a half tablet twice a day, with the goal of reaching a maintenance dose of 10mg  twice a day. -Consider cognitive behavioral therapy for mind training and retraining the brain to manage anxiety and depressive thoughts.

## 2023-06-19 ENCOUNTER — Encounter: Payer: Self-pay | Admitting: Urgent Care

## 2023-07-03 ENCOUNTER — Encounter: Payer: Self-pay | Admitting: Urgent Care

## 2023-07-03 DIAGNOSIS — F411 Generalized anxiety disorder: Secondary | ICD-10-CM

## 2023-07-03 DIAGNOSIS — F341 Dysthymic disorder: Secondary | ICD-10-CM

## 2023-07-03 DIAGNOSIS — G4700 Insomnia, unspecified: Secondary | ICD-10-CM

## 2023-07-03 MED ORDER — VENLAFAXINE HCL ER 75 MG PO CP24: 75.0000 mg | ORAL_CAPSULE | Freq: Every day | ORAL | 1 refills | Status: DC

## 2023-07-16 ENCOUNTER — Encounter: Payer: Self-pay | Admitting: Urgent Care

## 2023-07-16 ENCOUNTER — Ambulatory Visit: Payer: 59 | Admitting: Urgent Care

## 2023-07-16 ENCOUNTER — Other Ambulatory Visit (HOSPITAL_COMMUNITY)
Admission: RE | Admit: 2023-07-16 | Discharge: 2023-07-16 | Disposition: A | Discharge status: Home / Self Care | Source: Ambulatory Visit | Attending: Urgent Care | Admitting: Urgent Care

## 2023-07-16 VITALS — BP 140/90 | HR 75 | Wt 186.8 lb

## 2023-07-16 DIAGNOSIS — F341 Dysthymic disorder: Secondary | ICD-10-CM

## 2023-07-16 DIAGNOSIS — Z114 Encounter for screening for human immunodeficiency virus [HIV]: Secondary | ICD-10-CM

## 2023-07-16 DIAGNOSIS — Z13220 Encounter for screening for lipoid disorders: Secondary | ICD-10-CM

## 2023-07-16 DIAGNOSIS — E538 Deficiency of other specified B group vitamins: Secondary | ICD-10-CM | POA: Insufficient documentation

## 2023-07-16 DIAGNOSIS — Z124 Encounter for screening for malignant neoplasm of cervix: Secondary | ICD-10-CM

## 2023-07-16 DIAGNOSIS — Z1159 Encounter for screening for other viral diseases: Secondary | ICD-10-CM | POA: Diagnosis not present

## 2023-07-16 NOTE — Patient Instructions (Addendum)
 We updated your pap smear today. Please monitor your spotting - let me know in about 6 weeks if still having issues and we will obtain a pelvic ultrasound.  Continue your buspirone 10mg  twice daily and your venlafaxine once daily.  We checked your cholesterol and B12 today. Results will be on Mychart.  Return as needed for above concerns, or if stable and controlled, annually.

## 2023-07-16 NOTE — Progress Notes (Signed)
 Established Patient Office Visit  Subjective:  Patient ID: Stephanie Garcia, female    DOB: 14-Dec-1980  Age: 43 y.o. MRN: [id number]  Chief Complaint  Patient presents with   Gynecologic Exam    Pap and labs pts is fasting today.    Pt presents for pap smear and update fasting labs. Last pap 3 years ago, WNL. Does have hx of "spot" on cervix. Had colposcopy in the past, per pt report normal.   Has been on B12 SL x 1 month. Last B12 <200.  Has had spotting for the past week. Wearing a panty liner for the bleeding. No hx of this in the past, started mid-cycle. Normal cycle was scheduled to start around 07/24/23. No known hx of fibroids.   Pt start on buspirone one month ago, tolerating well. Had incomplete resolution to her depression therefore Effexor was added two weeks ago. Pt states overall she is doing much better. Feels sx are well controlled and no ADRs.    Gynecologic Exam    Patient Active Problem List   Diagnosis Date Noted   B12 deficiency 07/16/2023   Nabothian cyst 06/18/2023   Persistent depressive disorder 06/18/2023   Generalized anxiety disorder 06/18/2023   Insomnia 06/18/2023   Other fatigue 06/18/2023   Palpitations 06/18/2023   Past Medical History:  Diagnosis Date   Anxiety    want to talk about   Chicken pox    Depression    want to talk about   Past Surgical History:  Procedure Laterality Date   BREAST CYST EXCISION     TUBAL LIGATION     Social History   Tobacco Use   Smoking status: Never   Smokeless tobacco: Never  Vaping Use   Vaping status: Never Used  Substance Use Topics   Alcohol use: Yes    Alcohol/week: 1.0 standard drink of alcohol    Types: 1 Standard drinks or equivalent per week    Comment: social   Drug use: Never      ROS: as noted in HPI  Objective:     BP (!) 140/90   Pulse 75   Wt 186 lb 12.8 oz (84.7 kg)   LMP 05/31/2023 (Exact Date)   SpO2 100%   BMI 28.40 kg/m  BP Readings from Last 3 Encounters:   07/16/23 (!) 140/90  06/18/23 124/84  07/03/21 (!) 146/86   Wt Readings from Last 3 Encounters:  07/16/23 186 lb 12.8 oz (84.7 kg)  06/18/23 192 lb 6.4 oz (87.3 kg)  07/03/21 175 lb (79.4 kg)      Physical Exam Vitals reviewed. Exam conducted with a chaperone present.  Constitutional:      General: She is not in acute distress.    Appearance: Normal appearance. She is not ill-appearing, toxic-appearing or diaphoretic.  HENT:     Head: Normocephalic and atraumatic.  Cardiovascular:     Rate and Rhythm: Normal rate.  Pulmonary:     Effort: Pulmonary effort is normal. No respiratory distress.  Genitourinary:    Pubic Area: No rash or pubic lice.      Labia:        Right: No rash, tenderness, lesion or injury.        Left: No rash, tenderness, lesion or injury.      Urethra: No prolapse, urethral pain, urethral swelling or urethral lesion.     Vagina: Normal. No vaginal discharge or lesions.     Cervix: Cervical bleeding (scant blood noted) present. No  cervical motion tenderness, discharge, friability or lesion.     Uterus: Normal. Not deviated, not enlarged, not fixed, not tender and no uterine prolapse.      Adnexa: Right adnexa normal and left adnexa normal.       Right: No mass, tenderness or fullness.         Left: No mass, tenderness or fullness.       Rectum: Normal.     Lymphadenopathy:     Lower Body: No right inguinal adenopathy. No left inguinal adenopathy.  Skin:    General: Skin is warm and dry.     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     Mental Status: She is alert and oriented to person, place, and time.      No results found for any visits on 07/16/23.  Last CBC Lab Results  Component Value Date   WBC 8.7 06/18/2023   HGB 13.7 06/18/2023   HCT 41.5 06/18/2023   MCV 87.9 06/18/2023   RDW 14.1 06/18/2023   PLT 384.0 06/18/2023   Last metabolic panel Lab Results  Component Value Date    GLUCOSE 87 06/18/2023   NA 140 06/18/2023   K 4.6 06/18/2023   CL 104 06/18/2023   CO2 26 06/18/2023   BUN 10 06/18/2023   CREATININE 0.76 06/18/2023   GFR 95.73 06/18/2023   CALCIUM 10.0 06/18/2023   PROT 7.4 06/18/2023   ALBUMIN 4.8 06/18/2023   BILITOT 0.3 06/18/2023   ALKPHOS 68 06/18/2023   AST 15 06/18/2023   ALT 18 06/18/2023   Lab Results  Component Value Date   VITAMINB12 171 (L) 06/18/2023      The ASCVD Risk score (Arnett DK, et al., 2019) failed to calculate for the following reasons:   Cannot find a previous HDL lab   Cannot find a previous total cholesterol lab  Assessment & Plan:  Screening for cervical cancer -     Cytology - PAP  Encounter for screening for HIV -     HIV Antibody (routine testing w rflx)  Encounter for hepatitis C screening test for low risk patient -     Hepatitis C antibody  Persistent depressive disorder  B12 deficiency -     Vitamin B12  Lipid screening -     Lipid panel  Overall, patient doing very well on new medictions. Will continue current care plan. Monitor abnormal bleeding for now - if persistent or recurrent will perform further workup. The blood blister is not new per pt account. Check fasting labs today, including repeat B12 to assess response to SL therapy.             Return in about 11 months (around 06/17/2024).   Maretta Bees, PA

## 2023-07-17 ENCOUNTER — Encounter: Payer: Self-pay | Admitting: Urgent Care

## 2023-07-17 DIAGNOSIS — N939 Abnormal uterine and vaginal bleeding, unspecified: Secondary | ICD-10-CM

## 2023-07-17 LAB — HEPATITIS C ANTIBODY: Hepatitis C Ab: NONREACTIVE

## 2023-07-17 LAB — VITAMIN B12: Vitamin B-12: 466 pg/mL (ref 200–1100)

## 2023-07-17 LAB — LIPID PANEL
Cholesterol: 184 mg/dL (ref <200)
HDL: 56 mg/dL (ref >=50)
LDL Cholesterol (Calc): 109 mg/dL — ABNORMAL HIGH
Non-HDL Cholesterol (Calc): 128 mg/dL (ref <130)
Total CHOL/HDL Ratio: 3.3 (calc) (ref <5.0)
Triglycerides: 94 mg/dL (ref <150)

## 2023-07-17 LAB — HIV ANTIBODY (ROUTINE TESTING W REFLEX): HIV 1&2 Ab, 4th Generation: NONREACTIVE

## 2023-07-20 LAB — CYTOLOGY - PAP
Chlamydia: NEGATIVE
Comment: NEGATIVE
Comment: NEGATIVE
Comment: NEGATIVE
Comment: NORMAL
Diagnosis: UNDETERMINED — AB
High risk HPV: NEGATIVE
Neisseria Gonorrhea: NEGATIVE
Trichomonas: NEGATIVE

## 2023-07-24 ENCOUNTER — Ambulatory Visit

## 2023-07-24 DIAGNOSIS — N939 Abnormal uterine and vaginal bleeding, unspecified: Secondary | ICD-10-CM | POA: Diagnosis not present

## 2023-08-05 ENCOUNTER — Encounter: Payer: Self-pay | Admitting: Urgent Care

## 2023-08-11 ENCOUNTER — Encounter: Payer: Self-pay | Admitting: Urgent Care

## 2023-08-11 DIAGNOSIS — N939 Abnormal uterine and vaginal bleeding, unspecified: Secondary | ICD-10-CM

## 2023-08-11 DIAGNOSIS — D259 Leiomyoma of uterus, unspecified: Secondary | ICD-10-CM

## 2023-08-11 DIAGNOSIS — N859 Noninflammatory disorder of uterus, unspecified: Secondary | ICD-10-CM

## 2023-08-30 ENCOUNTER — Encounter: Payer: Self-pay | Admitting: Urgent Care

## 2023-09-01 ENCOUNTER — Encounter: Payer: Self-pay | Admitting: Urgent Care

## 2023-09-01 ENCOUNTER — Other Ambulatory Visit: Payer: Self-pay

## 2023-09-01 DIAGNOSIS — F411 Generalized anxiety disorder: Secondary | ICD-10-CM

## 2023-09-01 DIAGNOSIS — F341 Dysthymic disorder: Secondary | ICD-10-CM

## 2023-09-01 DIAGNOSIS — G4700 Insomnia, unspecified: Secondary | ICD-10-CM

## 2023-09-01 MED ORDER — VENLAFAXINE HCL ER 75 MG PO CP24: 75.0000 mg | ORAL_CAPSULE | Freq: Every day | ORAL | 0 refills | Status: DC

## 2023-09-02 ENCOUNTER — Encounter: Payer: Self-pay | Admitting: Obstetrics and Gynecology

## 2023-09-02 ENCOUNTER — Ambulatory Visit: Admitting: Obstetrics and Gynecology

## 2023-09-02 ENCOUNTER — Other Ambulatory Visit (HOSPITAL_COMMUNITY)
Admission: RE | Admit: 2023-09-02 | Discharge: 2023-09-02 | Disposition: A | Discharge status: Home / Self Care | Source: Ambulatory Visit | Attending: Obstetrics and Gynecology | Admitting: Obstetrics and Gynecology

## 2023-09-02 VITALS — BP 131/78 | HR 78 | Resp 16 | Ht 68.0 in | Wt 177.0 lb

## 2023-09-02 DIAGNOSIS — D219 Benign neoplasm of connective and other soft tissue, unspecified: Secondary | ICD-10-CM | POA: Diagnosis not present

## 2023-09-02 DIAGNOSIS — N939 Abnormal uterine and vaginal bleeding, unspecified: Secondary | ICD-10-CM | POA: Diagnosis present

## 2023-09-02 DIAGNOSIS — Z3202 Encounter for pregnancy test, result negative: Secondary | ICD-10-CM

## 2023-09-02 LAB — POCT URINE PREGNANCY: Preg Test, Ur: NEGATIVE

## 2023-09-02 MED ORDER — BUSPIRONE HCL 15 MG PO TABS: 15.0000 mg | ORAL_TABLET | Freq: Two times a day (BID) | ORAL | 2 refills | Status: DC

## 2023-09-02 MED ORDER — JUNEL FE 24 1-20 MG-MCG(24) PO TABS: 1.0000 | ORAL_TABLET | Freq: Every day | ORAL | 3 refills | Status: DC

## 2023-09-02 NOTE — Progress Notes (Signed)
 CC: No family hx of fibroids  No family hx of uterine cancer  Lymphoma- family hx   Not bleeding currently- stopped   Had pelvic ultrasound on 07/24/23   Cycle from 2/8-3/25   Normally 18-19 days

## 2023-09-02 NOTE — Progress Notes (Signed)
      GYNECOLOGY OFFICE PROCEDURE NOTE   Stephanie Garcia is a 43 y.o. G3P0 here for endometrial biopsy for AUB. Recent ultrasound also showed 3cm SM fibroid and EL 16.52mm. Pap on 07/16/23 was ACUS, negative HPV.   ENDOMETRIAL BIOPSY     The indications for endometrial biopsy were reviewed.   Risks of the biopsy including cramping, bleeding, infection, uterine perforation, inadequate specimen and need for additional procedures were discussed. Offered alternative of hysteroscopy, dilation and curettage in OR. The patient states she understands the R/B/I/A and agrees to undergo procedure today. Urine pregnancy test was Negative. Consent was signed. Time out was performed.    Patient was positioned in dorsal lithotomy position. A vaginal speculum was placed.  The cervix was visualized and was prepped with Betadine.  A single-toothed tenaculum was placed on the anterior lip of the cervix to stabilize it. The 3 mm pipelle was easily introduced into the endometrial cavity without difficulty to a depth of 9.5 cm, and a Moderate amount of tissue was obtained after two passes and sent to pathology. The instruments were removed from the patient's vagina. Minimal bleeding from the cervix was noted. The patient tolerated the procedure well.   Patient was given post procedure instructions.  Will follow up pathology and manage accordingly; patient will be contacted with results and recommendations.  Routine preventative health maintenance measures emphasized.     [name], MD Obstetrician & Gynecologist, [facility] for [employer], [facility]

## 2023-09-02 NOTE — Patient Instructions (Signed)
It is normal to have cramping and bleeding for the next 2-3 days. You should feel better every day.  Please call us if you have any severe pain, bleeding that soaks more than 1 pad in a hour, have fevers, or feel like you're going to pass out.  You can take tylenol '1000mg'$  every 8 hours and ibuprofen '800mg'$  every 8 hours as needed for pain. It is ok to take both at the same time.

## 2023-09-04 ENCOUNTER — Encounter: Payer: Self-pay | Admitting: Obstetrics and Gynecology

## 2023-09-04 LAB — SURGICAL PATHOLOGY

## 2023-09-07 ENCOUNTER — Encounter: Payer: Self-pay | Admitting: Obstetrics and Gynecology

## 2023-09-07 NOTE — Progress Notes (Signed)
   NEW GYNECOLOGY VISIT  Subjective:  Stephanie Garcia is a 43 y.o. G3P0 with hx BTL & LMP 08/12/23 presenting for AUB/fibroids  Over the past year and a half, her menstrual periods have become heavier, especially during the first two to three days, necessitating the use of ultra tampons and pads overnight to prevent leakage. The bleeding then tapers off and lasts for about seven days, which is longer than her usual cycle. Her cycle length has been irregular. Had 2-3 cycles this past year that were only 19 or 20 days. Cycles are usually 26-28 days.   In February, she experienced mid-cycle bleeding for the first time, lasting about fifteen to sixteen days. This bleeding was light, requiring only a panty liner.   Saw PCP, got a pap (ASCUS/HPV) and pelvic US  which revealed small fibroids and a thickened endometrium (16mm). She was referred here for further management.   No history of migraines with aura, high blood pressure, VTE, or significant family history of breast cancer. She also denies smoking or using tobacco products.  PMH: anxiety/depression, BMI 26 PSH: BTL, breast excision Meds: venlafaxine , buspar , B12 supplements, magnesium, MVI, vitamin C, and vitamin D   I personally reviewed the following: - CBC 06/18/23 Hgb 13.7 - TSH 06/18/23 0.91 - Pap 07/16/23 ASCUS/HPV negative  Objective:   Vitals:   09/02/23 0906  BP: 131/78  Pulse: 78  Resp: 16  Weight: 177 lb (80.3 kg)  Height: 5\' 8"  (1.727 m)   General:  Alert, oriented and cooperative. Patient is in no acute distress.  Skin: Skin is warm and dry. No rash noted.   Cardiovascular: Normal heart rate noted  Respiratory: Normal respiratory effort, no problems with respiration noted  Abdomen: Soft, non-tender, non-distended   Pelvic: NEFG. Cervix visually normal. Uterus mildly enlarged ~12 week size, mobile, non tender  Exam performed in the presence of a chaperone  Assessment and Plan:  Tatjana Turcott is a 43 y.o. with  AUB/fibroids  1. Abnormal uterine bleeding (AUB) (Primary) - We reviewed a focused differential diagnosis for AUB that includes, but is not limited to anovulatory cycles (menopausal transition/POI, PCOS, hypothalamic hypogonadism), endometrial/endocervical polyps, fibroids, and endometrial hyperplasia/endometrial cancer. Given her history, exam & work up thus far, the most likely etiology is menopause transition or fibroids. - Workup outlined above reassuring  - We discussed that EMB is not strictly indicated, but could be useful to rule out endometrial hyperplasia/malignancy. She opted to proceed with EMB, see separate procedure note.  - We reviewed management options including COCPs (continuous & cyclic), patch, ring, progestins (continuous & cyclic), LNG-IUD (Mirena), and Lysteda (TXA) - She opted for trial of COCs. No contraindications to estrogen.  - Educated on side effects of birth control pills, including thromboembolism and breast cancer risk, and benefits such as reduced ovarian and uterine cancer risk. - POCT urine pregnancy - Surgical pathology -     Norethindrone Acetate-Ethinyl Estrad-FE (JUNEL  FE 24) 1-20 MG-MCG(24) tablet; Take 1 tablet by mouth daily.  Return in about 3 months (around 12/02/2023) for follow up irregular periods.  Future Appointments  Date Time Provider Department Center  06/14/2024 10:00 AM [place], Dr[place]illa George, [place] [facility]    Izell Marsh, MD

## 2023-10-21 ENCOUNTER — Ambulatory Visit: Admitting: Urgent Care

## 2023-10-21 ENCOUNTER — Encounter: Payer: Self-pay | Admitting: Urgent Care

## 2023-10-21 VITALS — BP 156/91 | HR 84 | Temp 98.2°F | Wt 176.8 lb

## 2023-10-21 DIAGNOSIS — J22 Unspecified acute lower respiratory infection: Secondary | ICD-10-CM

## 2023-10-21 DIAGNOSIS — R051 Acute cough: Secondary | ICD-10-CM | POA: Diagnosis not present

## 2023-10-21 MED ORDER — GUAIFENESIN ER 600 MG PO TB12: 600.0000 mg | ORAL_TABLET | Freq: Two times a day (BID) | ORAL | 0 refills | Status: DC | PRN

## 2023-10-21 MED ORDER — AZITHROMYCIN 250 MG PO TABS: ORAL_TABLET | ORAL | 0 refills | Status: AC

## 2023-10-21 MED ORDER — HYDROCOD POLI-CHLORPHE POLI ER 10-8 MG/5ML PO SUER: 5.0000 mL | Freq: Every evening | ORAL | 0 refills | Status: DC | PRN

## 2023-10-21 MED ORDER — PREDNISONE 20 MG PO TABS: 20.0000 mg | ORAL_TABLET | Freq: Every day | ORAL | 0 refills | Status: AC

## 2023-10-21 NOTE — Patient Instructions (Signed)
 Please stop your current multisymptom cough medication.  Start taking azithromycin per box instructions. Start taking prednisone once daily with breakfast for the next 5 days. Use the Tussionex cough suppressant at night time only; this will improve sleep quality and likely make you feel drowsy.  Use plain mucinex (guaifenesin) 600mg  twice daily with plenty of water to help expel the mucous. Steam from a hot shower may also help with the mucous.  If your symptoms persist or worsen in the next week, please contact me to consider a chest xray.   Please call the [facility] office to schedule your follow up exam: [facility] at [facility] [address], [place], [place] [address] Phone: [phone]

## 2023-10-21 NOTE — Progress Notes (Unsigned)
 Established Patient Office Visit  Subjective:  Patient ID: Stephanie Garcia, female    DOB: 1980/11/02  Age: 43 y.o. MRN: [id number]  Chief Complaint  Patient presents with   Cough    Pt has been having coughing, post nasal drainage and low grade fever since Friday. She is no longer having these symptoms just the cough. She did take mucinex this morning around 8    URI  This is a new problem. The current episode started in the past 7 days (Started Friday, day 6 today). The problem has been gradually worsening. Maximum temperature: low grade - 99.8. The fever has been present for Less than 1 day. Associated symptoms include congestion, coughing, rhinorrhea, a sore throat and swollen glands. Pertinent negatives include no abdominal pain, chest pain, diarrhea, dysuria, headaches, joint pain, joint swelling, nausea, rash, vomiting or wheezing. She has tried acetaminophen , increased fluids and decongestant for the symptoms. The treatment provided mild relief.    Patient Active Problem List   Diagnosis Date Noted   B12 deficiency 07/16/2023   Nabothian cyst 06/18/2023   Persistent depressive disorder 06/18/2023   Generalized anxiety disorder 06/18/2023   Insomnia 06/18/2023   Other fatigue 06/18/2023   Palpitations 06/18/2023   Past Medical History:  Diagnosis Date   Anxiety    want to talk about   Chicken pox    Depression    want to talk about   Fibroids    Past Surgical History:  Procedure Laterality Date   BREAST CYST EXCISION     TUBAL LIGATION  07/24/2010   Social History   Tobacco Use   Smoking status: Never   Smokeless tobacco: Never  Vaping Use   Vaping status: Never Used  Substance Use Topics   Alcohol use: Yes    Alcohol/week: 1.0 standard drink of alcohol    Types: 1 Standard drinks or equivalent per week    Comment: social   Drug use: Never      ROS: as noted in HPI  Objective:     BP (!) 156/91   Pulse 84   Temp 98.2 F (36.8 C) (Oral)   Wt  176 lb 12.8 oz (80.2 kg)   SpO2 100%   BMI 26.88 kg/m  BP Readings from Last 3 Encounters:  10/21/23 (!) 156/91  09/02/23 131/78  07/16/23 (!) 140/90   Wt Readings from Last 3 Encounters:  10/21/23 176 lb 12.8 oz (80.2 kg)  09/02/23 177 lb (80.3 kg)  07/16/23 186 lb 12.8 oz (84.7 kg)      Physical Exam Vitals and nursing note reviewed.  Constitutional:      General: She is not in acute distress.    Appearance: Normal appearance. She is ill-appearing. She is not toxic-appearing or diaphoretic.  HENT:     Head: Normocephalic and atraumatic.     Right Ear: Tympanic membrane normal. No drainage, swelling or tenderness. No middle ear effusion. Tympanic membrane is not injected, scarred, perforated or erythematous.     Left Ear: No drainage, swelling or tenderness.  No middle ear effusion. Tympanic membrane is not injected, scarred, perforated or erythematous.     Nose: Congestion and rhinorrhea present.     Right Sinus: No maxillary sinus tenderness or frontal sinus tenderness.     Left Sinus: No maxillary sinus tenderness or frontal sinus tenderness.     Mouth/Throat:     Lips: Pink.     Mouth: Mucous membranes are moist.     Pharynx: Oropharynx  is clear. Uvula midline. No pharyngeal swelling, oropharyngeal exudate, posterior oropharyngeal erythema or uvula swelling.  Eyes:     General: No scleral icterus.       Right eye: No discharge.        Left eye: No discharge.     Extraocular Movements: Extraocular movements intact.     Pupils: Pupils are equal, round, and reactive to light.  Cardiovascular:     Rate and Rhythm: Normal rate and regular rhythm.     Heart sounds: No murmur heard. Pulmonary:     Effort: Pulmonary effort is normal. No respiratory distress.     Breath sounds: Rhonchi (to RLL posterior lung fields only, not cleared with coughing) present. No wheezing or rales.  Musculoskeletal:     Cervical back: Normal range of motion and neck supple. No rigidity or  tenderness.  Lymphadenopathy:     Cervical: Cervical adenopathy (B anterior cervical chain) present.  Skin:    General: Skin is warm and dry.     Coloration: Skin is not jaundiced or pale.     Findings: No bruising, erythema or rash.  Neurological:     General: No focal deficit present.     Mental Status: She is alert and oriented to person, place, and time.     Motor: No weakness.     Gait: Gait normal.  Psychiatric:        Mood and Affect: Mood normal.        Behavior: Behavior normal.      No results found for any visits on 10/21/23.  Last CBC Lab Results  Component Value Date   WBC 8.7 06/18/2023   HGB 13.7 06/18/2023   HCT 41.5 06/18/2023   MCV 87.9 06/18/2023   RDW 14.1 06/18/2023   PLT 384.0 06/18/2023   Last metabolic panel Lab Results  Component Value Date   GLUCOSE 87 06/18/2023   NA 140 06/18/2023   K 4.6 06/18/2023   CL 104 06/18/2023   CO2 26 06/18/2023   BUN 10 06/18/2023   CREATININE 0.76 06/18/2023   GFR 95.73 06/18/2023   CALCIUM 10.0 06/18/2023   PROT 7.4 06/18/2023   ALBUMIN 4.8 06/18/2023   BILITOT 0.3 06/18/2023   ALKPHOS 68 06/18/2023   AST 15 06/18/2023   ALT 18 06/18/2023      The 10-year ASCVD risk score (Arnett DK, et al., 2019) is: 0.9%  Assessment & Plan:  Lower respiratory tract infection -     Azithromycin; Take 2 tablets on day 1, then 1 tablet daily on days 2 through 5  Dispense: 6 tablet; Refill: 0 -     predniSONE; Take 1 tablet (20 mg total) by mouth daily with breakfast for 5 days.  Dispense: 5 tablet; Refill: 0 -     Hydrocod Poli-Chlorphe Poli ER; Take 5 mLs by mouth at bedtime as needed for cough.  Dispense: 115 mL; Refill: 0  Acute cough -     Hydrocod Poli-Chlorphe Poli ER; Take 5 mLs by mouth at bedtime as needed for cough.  Dispense: 115 mL; Refill: 0 -     guaiFENesin ER; Take 1 tablet (600 mg total) by mouth 2 (two) times daily as needed for to loosen phlegm or cough.  Dispense: 20 tablet; Refill: 0  Pt is on  day 6 of URI sx; discuss possible covid testing given sx presentation, however given lack of exposures and pt being outside of treatment window, this ultimately was deferred. Given rhonchi to RLL, will  tx for suspected bacterial infection (will cover for CAP) with zpack and prednisone. Cough med for night use only, mucinex during the day. Will consider CXR only pending response to tx.  No follow-ups on file.   Mandy Second, PA

## 2023-11-11 ENCOUNTER — Encounter: Payer: Self-pay | Admitting: Urgent Care

## 2023-11-11 DIAGNOSIS — F411 Generalized anxiety disorder: Secondary | ICD-10-CM

## 2023-11-19 MED ORDER — VENLAFAXINE HCL ER 150 MG PO CP24: 150.0000 mg | ORAL_CAPSULE | Freq: Every day | ORAL | 1 refills | Status: DC

## 2023-12-10 ENCOUNTER — Ambulatory Visit: Admitting: Obstetrics and Gynecology

## 2023-12-10 ENCOUNTER — Encounter: Payer: Self-pay | Admitting: Obstetrics and Gynecology

## 2023-12-10 VITALS — BP 138/88 | HR 76 | Wt 173.0 lb

## 2023-12-10 DIAGNOSIS — N939 Abnormal uterine and vaginal bleeding, unspecified: Secondary | ICD-10-CM

## 2023-12-10 DIAGNOSIS — D219 Benign neoplasm of connective and other soft tissue, unspecified: Secondary | ICD-10-CM | POA: Diagnosis not present

## 2023-12-10 MED ORDER — NORETHINDRONE-ETH ESTRADIOL 1-35 MG-MCG PO TABS: 1.0000 | ORAL_TABLET | Freq: Every day | ORAL | 11 refills | Status: AC

## 2023-12-10 NOTE — Progress Notes (Signed)
   RETURN GYNECOLOGY VISIT  Subjective:  Stephanie Garcia is a 43 y.o. G3P0 s/p BTL on OCPS for AUB related to SM fibroid & menopause transition presenting for follow up  Hot flashes resolved since starting COCs but bleeding can still be heavy and unpredictable. Taking OCP with dinner nightly. Otherwise doing OK but would like to have better control of bleeding  Last pelvic US  07/24/23, benign EMB 09/02/23, normal pap 07/16/23  Objective:   Vitals:   12/10/23 0938 12/10/23 0943  BP: (!) 153/88 138/88  Pulse: 90 76  Weight: 173 lb (78.5 kg)    General:  Alert, oriented and cooperative. Patient is in no acute distress.  Skin: Skin is warm and dry. No rash noted.   Cardiovascular: Normal heart rate noted  Respiratory: Normal respiratory effort, no problems with respiration noted   Assessment and Plan:  Stephanie Garcia is a 69 y.o. with AUB/fibroids  Abnormal uterine bleeding (AUB) Fibroids [D21.9] Will increase estradiol  dose to see if bleeding is better controlled in the next 3-6 months Reviewed option for hysteroscopic myomectomy and/or IUD insertion  Watch BP - initially elevated but recheck normal. If persistent elevated may need to transition to a different method -     norethindrone -ethinyl estradiol  1/35 (ORTHO-NOVUM) tablet; Take 1 tablet by mouth daily.  Return in about 3 months (around 03/11/2024) for follow up bleeding.  Future Appointments  Date Time Provider Department Center  06/13/2024 11:00 AM [place], [place], [place] [facility] None    Kieth JAYSON Carolin, MD

## 2023-12-31 ENCOUNTER — Encounter (INDEPENDENT_AMBULATORY_CARE_PROVIDER_SITE_OTHER): Payer: Self-pay | Admitting: Urgent Care

## 2023-12-31 DIAGNOSIS — G4700 Insomnia, unspecified: Secondary | ICD-10-CM

## 2023-12-31 DIAGNOSIS — F411 Generalized anxiety disorder: Secondary | ICD-10-CM

## 2024-01-01 MED ORDER — HYDROXYZINE PAMOATE 25 MG PO CAPS: 25.0000 mg | ORAL_CAPSULE | Freq: Three times a day (TID) | ORAL | 6 refills | Status: DC | PRN

## 2024-01-01 NOTE — Telephone Encounter (Signed)

## 2024-02-26 ENCOUNTER — Encounter: Payer: Self-pay | Admitting: Urgent Care

## 2024-04-10 ENCOUNTER — Other Ambulatory Visit: Payer: Self-pay | Admitting: Urgent Care

## 2024-04-10 DIAGNOSIS — F411 Generalized anxiety disorder: Secondary | ICD-10-CM

## 2024-04-10 DIAGNOSIS — G4700 Insomnia, unspecified: Secondary | ICD-10-CM

## 2024-04-18 ENCOUNTER — Encounter: Payer: Self-pay | Admitting: Urgent Care

## 2024-04-20 ENCOUNTER — Other Ambulatory Visit: Payer: Self-pay

## 2024-04-20 DIAGNOSIS — F411 Generalized anxiety disorder: Secondary | ICD-10-CM

## 2024-04-20 DIAGNOSIS — G4700 Insomnia, unspecified: Secondary | ICD-10-CM

## 2024-04-20 MED ORDER — HYDROXYZINE PAMOATE 25 MG PO CAPS: 25.0000 mg | ORAL_CAPSULE | Freq: Three times a day (TID) | ORAL | 3 refills | Status: DC | PRN

## 2024-04-23 ENCOUNTER — Other Ambulatory Visit: Payer: Self-pay | Admitting: Urgent Care

## 2024-04-23 DIAGNOSIS — F411 Generalized anxiety disorder: Secondary | ICD-10-CM

## 2024-05-17 ENCOUNTER — Other Ambulatory Visit: Payer: Self-pay | Admitting: Urgent Care

## 2024-05-17 DIAGNOSIS — F411 Generalized anxiety disorder: Secondary | ICD-10-CM

## 2024-05-17 NOTE — Telephone Encounter (Signed)
 Patient is scheduled on 06/14/24

## 2024-06-13 ENCOUNTER — Encounter: Admitting: Urgent Care

## 2024-06-14 ENCOUNTER — Encounter: Admitting: Urgent Care

## 2024-06-14 ENCOUNTER — Ambulatory Visit: Payer: 59 | Admitting: Urgent Care

## 2024-06-15 ENCOUNTER — Other Ambulatory Visit: Payer: Self-pay | Admitting: Urgent Care

## 2024-06-15 DIAGNOSIS — G4700 Insomnia, unspecified: Secondary | ICD-10-CM

## 2024-06-15 DIAGNOSIS — F411 Generalized anxiety disorder: Secondary | ICD-10-CM

## 2024-06-15 NOTE — Telephone Encounter (Signed)
 Patient has an appt later this week.

## 2024-06-20 ENCOUNTER — Encounter: Admitting: Urgent Care

## 2024-07-06 ENCOUNTER — Encounter: Admitting: Urgent Care
